# Patient Record
Sex: Male | Born: 1960 | Race: White | Hispanic: No | Marital: Single | Smoking: Current every day smoker
Health system: Southern US, Community
[De-identification: ages and names within clinical notes are randomized; demographics above are authoritative.]

## PROBLEM LIST (undated history)

## (undated) DIAGNOSIS — I10 Essential (primary) hypertension: Secondary | ICD-10-CM

## (undated) DIAGNOSIS — D751 Secondary polycythemia: Secondary | ICD-10-CM

## (undated) DIAGNOSIS — E785 Hyperlipidemia, unspecified: Secondary | ICD-10-CM

## (undated) DIAGNOSIS — E119 Type 2 diabetes mellitus without complications: Secondary | ICD-10-CM

## (undated) DIAGNOSIS — Z87442 Personal history of urinary calculi: Secondary | ICD-10-CM

## (undated) DIAGNOSIS — F17200 Nicotine dependence, unspecified, uncomplicated: Secondary | ICD-10-CM

## (undated) DIAGNOSIS — IMO0001 Reserved for inherently not codable concepts without codable children: Secondary | ICD-10-CM

## (undated) HISTORY — DX: Nicotine dependence, unspecified, uncomplicated: F17.200

## (undated) HISTORY — DX: Type 2 diabetes mellitus without complications: E11.9

## (undated) HISTORY — DX: Hyperlipidemia, unspecified: E78.5

## (undated) HISTORY — DX: Secondary polycythemia: D75.1

## (undated) HISTORY — PX: NO PAST SURGERIES: SHX2092

## (undated) HISTORY — DX: Reserved for inherently not codable concepts without codable children: IMO0001

---

## 2002-10-26 ENCOUNTER — Emergency Department (HOSPITAL_COMMUNITY): Admission: EM | Admit: 2002-10-26 | Discharge: 2002-10-26 | Payer: Self-pay | Admitting: Emergency Medicine

## 2009-04-27 ENCOUNTER — Ambulatory Visit (HOSPITAL_COMMUNITY): Admission: RE | Admit: 2009-04-27 | Discharge: 2009-04-27 | Payer: Self-pay | Admitting: Sports Medicine

## 2009-11-23 ENCOUNTER — Encounter (INDEPENDENT_AMBULATORY_CARE_PROVIDER_SITE_OTHER): Payer: Self-pay | Admitting: *Deleted

## 2009-12-22 ENCOUNTER — Encounter (INDEPENDENT_AMBULATORY_CARE_PROVIDER_SITE_OTHER): Payer: Self-pay | Admitting: *Deleted

## 2009-12-31 ENCOUNTER — Ambulatory Visit: Payer: Self-pay | Admitting: Internal Medicine

## 2010-01-13 ENCOUNTER — Telehealth: Payer: Self-pay | Admitting: Internal Medicine

## 2010-01-14 ENCOUNTER — Ambulatory Visit: Payer: Self-pay | Admitting: Internal Medicine

## 2010-01-14 LAB — HM COLONOSCOPY

## 2010-01-22 ENCOUNTER — Encounter: Payer: Self-pay | Admitting: Internal Medicine

## 2010-03-07 ENCOUNTER — Emergency Department (HOSPITAL_COMMUNITY)
Admission: EM | Admit: 2010-03-07 | Discharge: 2010-03-07 | Payer: Self-pay | Source: Home / Self Care | Admitting: Emergency Medicine

## 2010-05-03 NOTE — Miscellaneous (Signed)
Summary: [facility]  Clinical Lists Changes  Medications: Added new medication of MOVIPREP 100 GM  SOLR (PEG-KCL-NACL-NASULF-NA ASC-C) As per prep instructions. - Signed Rx of MOVIPREP 100 GM  SOLR (PEG-KCL-NACL-NASULF-NA ASC-C) As per prep instructions.;  #1 x 0;  Signed;  Entered by: Ezra Sites RN;  Authorized by: Iva Boop MD, [facility];  Method used: [facility] [address] [phone]*, [address] [place], [place], [place]  [address], Ph: [phone] or [phone], Fax: [phone] Observations: Added new observation of NKA: T (12/31/2009 10:48)    Prescriptions: MOVIPREP 100 GM  SOLR (PEG-KCL-NACL-NASULF-NA ASC-C) As per prep instructions.  #1 x 0   Entered by:   Ezra Sites RN   Authorized by:   Iva Boop MD, [facility]   Signed by:   Ezra Sites RN on 12/31/2009   Method used:   [facility]ronically to        [facility] [address] [phone]* (retail)       [address]       [place], [place]  [address]       Ph: [phone] or [phone]       Fax: [phone]   RxID:   [id number]

## 2010-05-03 NOTE — Progress Notes (Signed)
Summary: Lost prep instructions  Phone Note Call from Patient Call back at [phone]   Call For: Dr Leone Payor Summary of Call: Lost his prep instructions and does not know how to prep for his procedure tomorrow am. Initial call taken by: Leanor Kail [facility],  January 13, 2010 9:30 AM  Follow-up for Phone Call        Left a message at given number.  There was an ID. Pt. told to call me back asap for prep instructions.   I spoke with the patient about fifteen minutes later and explained the prep to him.  I also enouraged him to keep drinking clear liquids so he doesn't get dehydrated.   Patient was fine, and stated that he would see [place] tomorrow. Follow-up by: Clide Cliff, RN

## 2010-05-03 NOTE — Letter (Signed)
Summary: Patient Notice- Polyp Results  [facility]  [address]   [place], [place] [address]   Phone: [phone]  Fax: [phone]        January 22, 2010 MRN: [id number]    [facility] [address] [place], [place]  [address]    Dear Mr. Collin Farmer,  One of the polyps removed from your colon was adenomatous. This means that it was pre-cancerous or that  it had the potential to change into cancer over time. the other polyp was not pre-cancerous.  I recommend that you have a repeat colonoscopy in 5 years to determine if you have developed any new polyps over time and to screen for colorectal cancer. If you develop any new rectal bleeding, abdominal pain or significant bowel habit changes, please contact us before then.  In addition to repeating colonoscopy, changing health habits may reduce your risk of having more colon polyps and possibly, colon cancer. You may lower your risk of future polyps and colon cancer by adopting healthy habits such as not smoking or using tobacco (if you do), being physically active, losing weight (if overweight), and eating a diet which includes fruits and vegetables and limits red meat.  Please call us if you are having persistent problems or have questions about your condition that have not been fully answered at this time.  Sincerely,  Iva Boop MD, [facility]  This letter has been electronically signed by your physician.  Appended Document: Patient Notice- Polyp Results letter mailed

## 2010-05-03 NOTE — Letter (Signed)
Summary: [facility] Instructions  [facility]  [address] [place], [place] [address]   Phone: [phone]  Fax: [phone]       Collin [facility]    Dec 28, 1960    MRN: [id number]        Procedure Day /Date: Friday 01-14-10     Arrival Time: 8:00 a.m.     Procedure Time: 9:00 a.m.     Location of Procedure:                    _x _  [facility] (4th Floor)   PREPARATION FOR COLONOSCOPY WITH MOVIPREP   Starting 5 days prior to your procedure  01-09-10  do not eat nuts, seeds, popcorn, corn, beans, peas,  salads, or any raw vegetables.  Do not take any fiber supplements (e.g. Metamucil, Citrucel, and Benefiber).  THE DAY BEFORE YOUR PROCEDURE         DATE:  01-13-10 DAY: Thursday   1.  Drink clear liquids the entire day-NO SOLID FOOD  2.  Do not drink anything colored red or purple.  Avoid juices with pulp.  No orange juice.  3.  Drink at least 64 oz. (8 glasses) of fluid/clear liquids during the day to prevent dehydration and help the prep work efficiently.  CLEAR LIQUIDS INCLUDE: Water Jello Ice Popsicles Tea (sugar ok, no milk/cream) Powdered fruit flavored drinks Coffee (sugar ok, no milk/cream) Gatorade Juice: apple, white grape, white cranberry  Lemonade Clear bullion, consomm, broth Carbonated beverages (any kind) Strained chicken noodle soup Hard Candy                             4.  In the morning, mix first dose of MoviPrep solution:    Empty 1 Pouch A and 1 Pouch B into the disposable container    Add lukewarm drinking water to the top line of the container. Mix to dissolve    Refrigerate (mixed solution should be used within 24 hrs)  5.  Begin drinking the prep at 5:00 p.m. The MoviPrep container is divided by 4 marks.   Every 15 minutes drink the solution down to the next mark (approximately 8 oz) until the full liter is complete.   6.  Follow completed prep with 16 oz of clear liquid of your choice (Nothing red or  purple).  Continue to drink clear liquids until bedtime.  7.  Before going to bed, mix second dose of MoviPrep solution:    Empty 1 Pouch A and 1 Pouch B into the disposable container    Add lukewarm drinking water to the top line of the container. Mix to dissolve    Refrigerate  THE DAY OF YOUR PROCEDURE      DATE:  01-14-10 DAY:  Friday  Beginning at  4:00 a.m. (5 hours before procedure):         1. Every 15 minutes, drink the solution down to the next mark (approx 8 oz) until the full liter is complete.  2. Follow completed prep with 16 oz. of clear liquid of your choice.    3. You may drink clear liquids until  7:00 a.m. (2 HOURS BEFORE PROCEDURE).   MEDICATION INSTRUCTIONS  Unless otherwise instructed, you should take regular prescription medications with a small sip of water   as early as possible the morning of your procedure.    Additional medication instructions:Do not take Lisinopril/HCTZ day of procedure.  OTHER INSTRUCTIONS  You will need a responsible adult at least 52 years of age to accompany you and drive you home.   This person must remain in the waiting room during your procedure.  Wear loose fitting clothing that is easily removed.  Leave jewelry and other valuables at home.  However, you may wish to bring a book to read or  an iPod/MP3 player to listen to music as you wait for your procedure to start.  Remove all body piercing jewelry and leave at home.  Total time from sign-in until discharge is approximately 2-3 hours.  You should go home directly after your procedure and rest.  You can resume normal activities the  day after your procedure.  The day of your procedure you should not:   Drive   Make legal decisions   Operate machinery   Drink alcohol   Return to work  You will receive specific instructions about eating, activities and medications before you leave.    The above instructions have been reviewed and explained  to me by   Ezra Sites RN  December 31, 2009 11:27 AM     I fully understand and can verbalize these instructions _____________________________ Date _________

## 2010-05-03 NOTE — Letter (Signed)
Summary: Previsit letter  [facility]  [address] [place], [place] [address]   Phone: [phone]  Fax: [phone]       11/23/2009 MRN: [id number]  [facility] [address] [place], [place]  [address]  Dear Collin Farmer,  Welcome to the Gastroenterology Division at [facility].    You are scheduled to see a nurse for your pre-procedure visit on 12/24/2009 at 1:00PM on the 3rd floor at [facility], [address]  We ask that you try to arrive at our office 15 minutes prior to your appointment time to allow for check-in.  Your nurse visit will consist of discussing your medical and surgical history, your immediate family medical history, and your medications.    Please bring a complete list of all your medications or, if you prefer, bring the medication bottles and we will list them.  We will need to be aware of both prescribed and over the counter drugs.  We will need to know exact dosage information as well.  If you are on blood thinners (Coumadin, Plavix, Aggrenox, Ticlid, etc.) please call our office today/prior to your appointment, as we need to consult with your physician about holding your medication.   Please be prepared to read and sign documents such as consent forms, a financial agreement, and acknowledgement forms.  If necessary, and with your consent, a friend or relative is welcome to sit-in on the nurse visit with you.  Please bring your insurance card so that we may make a copy of it.  If your insurance requires a referral to see a specialist, please bring your referral form from your primary care physician.  No co-pay is required for this nurse visit.     If you cannot keep your appointment, please call [phone] to cancel or reschedule prior to your appointment date.  This allows [place] the opportunity to schedule an appointment for another patient in need of care.    Thank you for choosing [facility] for your medical  needs.  We appreciate the opportunity to care for you.  Please visit [place] at our website  to learn more about our practice.                     Sincerely.                                                                                                                   The Gastroenterology Division

## 2010-05-03 NOTE — Procedures (Signed)
Summary: Colonoscopy  Patient: [name] Note: All result statuses are Final unless otherwise noted.  Tests: (1) Colonoscopy (COL)   COL Colonoscopy           DONE     [facility]     [address]     [place], [place]  [address]           COLONOSCOPY PROCEDURE REPORT           PATIENT:  Collin Farmer, Collin Farmer  MR#:  [id number]     BIRTHDATE:  03-03-1961, 49 yrs. old  GENDER:  male     ENDOSCOPIST:  Iva Boop, MD, [facility]     REF. BY:  Lynnea Ferrier, M.D.     PROCEDURE DATE:  01/14/2010     PROCEDURE:  Colonoscopy with snare polypectomy     ASA CLASS:  Class II     INDICATIONS:  Routine Risk Screening     MEDICATIONS:   Fentanyl 75 mcg IV, Versed 8 mg           DESCRIPTION OF PROCEDURE:   After the risks benefits and     alternatives of the procedure were thoroughly explained, informed     consent was obtained.  Digital rectal exam was performed and     revealed no rectal masses and an enlarged prostate.  Mildly     enlarged, smooth, with preserved median sulcus. The LB 180AL     [id number] endoscope was introduced through the anus and advanced to     the cecum, which was identified by both the appendix and ileocecal     valve, without limitations.  The quality of the prep was     excellent, using MoviPrep.  The instrument was then slowly     withdrawn as the colon was fully examined. Insertion: 2:54 minutes     Withdrawal: 13:29 minutes     <<PROCEDUREIMAGES>>           FINDINGS:  Two polyps were found in the rectum. One 3 mm and one     6-7 mm. Polyps were snared without cautery. Retrieval was     successful. snare polyp  Moderate diverticulosis was found in the     sigmoid colon.  This was otherwise a normal examination of the     colon.   Retroflexed views in the rectum revealed no     abnormalities.    The scope was then withdrawn from the patient     and the procedure completed.           COMPLICATIONS:  None     ENDOSCOPIC IMPRESSION:     1) Two polyps  removed from the rectum, (3mm and 6-28mm)     2) Moderate diverticulosis in the sigmoid colon     3) Otherwise normal examination with excellent prep     4) Mildly enlarged prostate without nodules.     RECOMMENDATIONS:     Keep regular follow-up with Dr. Tanya Nones re: prostate.           REPEAT EXAM:  In for Colonoscopy, pending biopsy results.           Iva Boop, MD, [facility]           CC:  Lynnea Ferrier, MD     The Patient           n.     eSIGNED:   Iva Boop at 01/14/2010 09:41 AM  Simon, [place], [id number]  Note: An exclamation mark (!) indicates a result that was not dispersed into the flowsheet. Document Creation Date: 01/14/2010 9:42 AM _______________________________________________________________________  (1) Order result status: Final Collection or observation date-time: 01/14/2010 09:32 Requested date-time:  Receipt date-time:  Reported date-time:  Referring Physician:   Ordering Physician: Stan Head [phone]) Specimen Source:  Source: Launa Grill Order Number: [id number] Lab site:   Appended Document: Colonoscopy: 1 DIMINUTIVE ADENOMA   Colonoscopy  Procedure date:  01/14/2010  Findings:      1) Two polyps removed from the rectum, (3mm and 6-64mm) 1 ADENOMA 1 HYPERPLASTIC     2) Moderate diverticulosis in the sigmoid colon     3) Otherwise normal examination with excellent prep     4) Mildly enlarged prostate without nodules.  Comments:      Repeat colonoscopy in 5 years.   Procedures Next Due Date:    Colonoscopy: 01/2015   Appended Document: Colonoscopy     Procedures Next Due Date:    Colonoscopy: 01/2015

## 2010-06-14 LAB — POCT URINALYSIS DIPSTICK
Glucose, UA: NEGATIVE mg/dL
Ketones, ur: NEGATIVE mg/dL
Protein, ur: NEGATIVE mg/dL
Specific Gravity, Urine: 1.025 (ref 1.005–1.030)

## 2012-06-29 ENCOUNTER — Emergency Department (HOSPITAL_COMMUNITY)
Admission: EM | Admit: 2012-06-29 | Discharge: 2012-06-29 | Disposition: A | Discharge status: Home / Self Care | Payer: BC Managed Care – PPO | Source: Home / Self Care | Attending: Family Medicine | Admitting: Family Medicine

## 2012-06-29 ENCOUNTER — Encounter (HOSPITAL_COMMUNITY): Payer: Self-pay | Admitting: *Deleted

## 2012-06-29 ENCOUNTER — Emergency Department (INDEPENDENT_AMBULATORY_CARE_PROVIDER_SITE_OTHER): Payer: BC Managed Care – PPO

## 2012-06-29 DIAGNOSIS — M65839 Other synovitis and tenosynovitis, unspecified forearm: Secondary | ICD-10-CM

## 2012-06-29 DIAGNOSIS — M65849 Other synovitis and tenosynovitis, unspecified hand: Secondary | ICD-10-CM

## 2012-06-29 DIAGNOSIS — M778 Other enthesopathies, not elsewhere classified: Secondary | ICD-10-CM

## 2012-06-29 HISTORY — DX: Essential (primary) hypertension: I10

## 2012-06-29 MED ORDER — DICLOFENAC SODIUM 1 % TD GEL: 4.0000 g | Freq: Four times a day (QID) | TRANSDERMAL | Status: DC

## 2012-06-29 NOTE — ED Notes (Signed)
C/O bilat metacarpal area discomfort over past 2 wks.  Yesterday started with significant pain, swelling, and tender soft "lumps" to left posterior hand and wrist (dominant hand).  Pt removed wedding ring upon request.

## 2012-06-29 NOTE — ED Provider Notes (Signed)
History     CSN: [id number]  Arrival date & time 06/29/12  1124   First MD Initiated Contact with Patient 06/29/12 1131      Chief Complaint  Patient presents with  . Hand Pain  . Edema    (Consider location/radiation/quality/duration/timing/severity/associated sxs/prior treatment) Patient is a 52 y.o. male presenting with hand pain. The history is provided by the patient.  Hand Pain This is a new (NKI to hand/wrist.) problem. The current episode started more than 1 week ago. The problem has been gradually worsening (more swollen past 2 days.). 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    Past Medical History  Diagnosis Date  . Hypertension     History reviewed. No pertinent past surgical history.  No family history on file.  History  Substance Use Topics  . Smoking status: Current Every Day Smoker -- 0.50 packs/day    Types: Cigarettes  . Smokeless tobacco: Not on file  . Alcohol Use: No      Review of Systems  Constitutional: Negative.   Musculoskeletal: Positive for joint swelling. Negative for myalgias.  Skin: Negative for rash and wound.    Allergies  Other  Home Medications   Current Outpatient Rx  Name  Route  Sig  Dispense  Refill  . aspirin 81 MG tablet   Oral   Take 81 mg by mouth daily.         Marland Kitchen LISINOPRIL-HYDROCHLOROTHIAZIDE PO   Oral   Take by mouth.         . diclofenac sodium (VOLTAREN) 1 % GEL   Topical   Apply 4 g topically 4 (four) times daily.   100 g   2     BP 138/84  Pulse 73  Temp(Src) 97.8 F (36.6 C) (Oral)  Resp 14  Wt 175 lb (79.379 kg)  Physical Exam  Nursing note and vitals reviewed. Constitutional: He is oriented to person, place, and time. He appears well-developed and well-nourished.  Musculoskeletal: He exhibits tenderness.       Hands: Neurological: He is alert and oriented to person, place, and time.  Skin: Skin is warm and dry.    ED Course    Procedures (including critical care time)  Labs Reviewed - No data to display Dg Wrist Complete Left  06/29/2012  *RADIOLOGY REPORT*  Clinical Data: Posterior pain in the left wrist.  LEFT WRIST - COMPLETE 3+ VIEW  Comparison: None.  Findings: Four views of the left wrist were obtained.  No evidence for acute fracture or dislocation. Spurring and degenerative changes in the distal radioulnar joint.  Normal alignment of the left wrist.  IMPRESSION: No acute bony abnormality within the left wrist.  Degenerative changes in the distal radioulnar joint.   Original Report Authenticated By: Richarda Overlie, M.D.      1. Wrist tendonitis       MDM  X-rays reviewed and report per radiologist.        Linna Hoff, MD 06/29/12 1322

## 2013-05-27 ENCOUNTER — Other Ambulatory Visit: Payer: Self-pay | Admitting: Orthopedic Surgery

## 2013-05-27 DIAGNOSIS — M25532 Pain in left wrist: Secondary | ICD-10-CM

## 2013-05-30 ENCOUNTER — Other Ambulatory Visit: Payer: BC Managed Care – PPO

## 2013-06-04 ENCOUNTER — Telehealth: Payer: Self-pay | Admitting: Family Medicine

## 2013-06-04 NOTE — Telephone Encounter (Signed)
Call back number is [phone] Pharmacy is [facility] in [place] Pt is needing a refill on LISINOPRIL-HYDROCHLOROTHIAZIDE PO Simvastatin

## 2013-06-06 ENCOUNTER — Inpatient Hospital Stay: Admission: RE | Admit: 2013-06-06 | Payer: BC Managed Care – PPO | Source: Ambulatory Visit

## 2013-06-06 MED ORDER — LISINOPRIL-HYDROCHLOROTHIAZIDE 20-12.5 MG PO TABS: 1.0000 | ORAL_TABLET | Freq: Every day | ORAL | Status: DC

## 2013-06-06 NOTE — Telephone Encounter (Signed)
Rx Refilled  

## 2013-08-15 ENCOUNTER — Ambulatory Visit: Payer: Self-pay | Admitting: Family Medicine

## 2013-08-22 ENCOUNTER — Ambulatory Visit: Payer: Self-pay | Admitting: Family Medicine

## 2013-08-29 ENCOUNTER — Ambulatory Visit: Payer: Self-pay | Admitting: Family Medicine

## 2013-08-29 ENCOUNTER — Telehealth: Payer: Self-pay | Admitting: Family Medicine

## 2013-08-29 MED ORDER — LISINOPRIL-HYDROCHLOROTHIAZIDE 20-12.5 MG PO TABS
1.0000 | ORAL_TABLET | Freq: Every day | ORAL | Status: DC
Start: 2013-08-29 — End: 2013-09-05

## 2013-08-29 NOTE — Telephone Encounter (Signed)
Rx Refilled  

## 2013-08-29 NOTE — Telephone Encounter (Signed)
[phone] Pt had an apt to come in this morning but due to power outage he has rescheduled till next Friday but he is out of his med and needs a refill  lisinopril-hydrochlorothiazide (PRINZIDE,ZESTORETIC) 20-12.5 MG per tablet   [facility]

## 2013-09-05 ENCOUNTER — Encounter: Payer: Self-pay | Admitting: Family Medicine

## 2013-09-05 ENCOUNTER — Ambulatory Visit (INDEPENDENT_AMBULATORY_CARE_PROVIDER_SITE_OTHER): Payer: BC Managed Care – PPO | Admitting: Family Medicine

## 2013-09-05 VITALS — BP 120/74 | HR 78 | Temp 98.3°F | Resp 16 | Ht 69.0 in | Wt 212.0 lb

## 2013-09-05 DIAGNOSIS — I10 Essential (primary) hypertension: Secondary | ICD-10-CM

## 2013-09-05 DIAGNOSIS — Z125 Encounter for screening for malignant neoplasm of prostate: Secondary | ICD-10-CM

## 2013-09-05 DIAGNOSIS — Z23 Encounter for immunization: Secondary | ICD-10-CM

## 2013-09-05 DIAGNOSIS — F17200 Nicotine dependence, unspecified, uncomplicated: Secondary | ICD-10-CM | POA: Insufficient documentation

## 2013-09-05 LAB — COMPLETE METABOLIC PANEL WITH GFR
ALK PHOS: 74 U/L (ref 39–117)
ALT: 39 U/L (ref 0–53)
AST: 23 U/L (ref 0–37)
Albumin: 4.1 g/dL (ref 3.5–5.2)
BILIRUBIN TOTAL: 0.5 mg/dL (ref 0.2–1.2)
BUN: 10 mg/dL (ref 6–23)
CALCIUM: 9.4 mg/dL (ref 8.4–10.5)
CHLORIDE: 100 meq/L (ref 96–112)
CO2: 27 mEq/L (ref 19–32)
CREATININE: 0.88 mg/dL (ref 0.50–1.35)
GFR, Est African American: >89 mL/min
GFR, Est Non African American: >89 mL/min
Glucose, Bld: 78 mg/dL (ref 70–99)
Potassium: 4.5 mEq/L (ref 3.5–5.3)
Sodium: 140 mEq/L (ref 135–145)
Total Protein: 6.8 g/dL (ref 6.0–8.3)

## 2013-09-05 LAB — CBC WITH DIFFERENTIAL/PLATELET
BASOS ABS: 0 10*3/uL (ref 0.0–0.1)
BASOS PCT: 0 % (ref 0–1)
EOS ABS: 0.1 10*3/uL (ref 0.0–0.7)
Eosinophils Relative: 1 % (ref 0–5)
HCT: 52.6 % — ABNORMAL HIGH (ref 39.0–52.0)
Hemoglobin: 18.9 g/dL — ABNORMAL HIGH (ref 13.0–17.0)
Lymphocytes Relative: 24 % (ref 12–46)
Lymphs Abs: 2.3 10*3/uL (ref 0.7–4.0)
MCH: 31.2 pg (ref 26.0–34.0)
MCHC: 35.9 g/dL (ref 30.0–36.0)
MCV: 86.8 fL (ref 78.0–100.0)
Monocytes Absolute: 0.9 10*3/uL (ref 0.1–1.0)
Monocytes Relative: 9 % (ref 3–12)
NEUTROS ABS: 6.3 10*3/uL (ref 1.7–7.7)
NEUTROS PCT: 66 % (ref 43–77)
PLATELETS: 219 10*3/uL (ref 150–400)
RBC: 6.06 MIL/uL — ABNORMAL HIGH (ref 4.22–5.81)
RDW: 14.3 % (ref 11.5–15.5)
WBC: 9.6 10*3/uL (ref 4.0–10.5)

## 2013-09-05 LAB — LIPID PANEL
CHOL/HDL RATIO: 4.8 ratio
Cholesterol: 191 mg/dL (ref 0–200)
HDL: 40 mg/dL (ref >39)
LDL CALC: 136 mg/dL — AB (ref 0–99)
TRIGLYCERIDES: 77 mg/dL (ref <150)
VLDL: 15 mg/dL (ref 0–40)

## 2013-09-05 MED ORDER — LISINOPRIL-HYDROCHLOROTHIAZIDE 20-12.5 MG PO TABS: 1.0000 | ORAL_TABLET | Freq: Every day | ORAL | Status: DC

## 2013-09-05 MED ORDER — HYDROCORTISONE 2.5 % RE CREA: 1.0000 "application " | TOPICAL_CREAM | Freq: Two times a day (BID) | RECTAL | Status: DC

## 2013-09-05 NOTE — Progress Notes (Signed)
Subjective:    Patient ID: Collin Farmer, male    DOB: 02-15-1961, 53 y.o.   MRN: [id number]  HPI Patient coming in for refill on his blood pressure medication. He is currently taking Zestoretic 20/12.5 one by mouth daily. His blood pressure is well controlled at 120/74. He denies any chest pain, dyspnea on exertion, angina, shortness of breath. He denies any cough or cramping. He is overdue for fasting lab work. Unfortunately is not exercising regularly. He also continues to smoke half a pack of serous per day. His colonoscopy is up to date. He is due for a tetanus vaccine. He is also due for a prostate exam. Unfortunately is suffering from an external hemorrhoid. Therefore he declines a rectal exam the prostate. He is also requesting cream to treat the hemorrhoid. Past Medical History  Diagnosis Date  . Hypertension   . Smoking    Current Outpatient Prescriptions on File Prior to Visit  Medication Sig Dispense Refill  . aspirin 81 MG tablet Take 81 mg by mouth daily.      . diclofenac sodium (VOLTAREN) 1 % GEL Apply 4 g topically 4 (four) times daily.  100 g  2   No current facility-administered medications on file prior to visit.   Allergies  Allergen Reactions  . Other     Bee stings   History   Social History  . Marital Status: Single    Spouse Name: N/A    Number of Children: N/A  . Years of Education: N/A   Occupational History  . Not on file.   Social History Main Topics  . Smoking status: Current Every Day Smoker -- 0.50 packs/day    Types: Cigarettes  . Smokeless tobacco: Not on file  . Alcohol Use: No  . Drug Use: No  . Sexual Activity: Not on file   Other Topics Concern  . Not on file   Social History Narrative  . No narrative on file      Review of Systems  All other systems reviewed and are negative.      Objective:   Physical Exam  Vitals reviewed. Constitutional: He appears well-developed and well-nourished. No distress.  HENT:  Head:  Normocephalic and atraumatic.  Eyes: Conjunctivae are normal. No scleral icterus.  Neck: Neck supple. No JVD present. No thyromegaly present.  Cardiovascular: Normal rate, regular rhythm and normal heart sounds.  Exam reveals no gallop and no friction rub.   No murmur heard. Pulmonary/Chest: Effort normal and breath sounds normal. No respiratory distress. He has no wheezes. He has no rales. He exhibits no tenderness.  Abdominal: Soft. Bowel sounds are normal. He exhibits no distension and no mass. There is no tenderness. There is no rebound and no guarding.  Musculoskeletal: He exhibits no edema.  Lymphadenopathy:    He has no cervical adenopathy.  Skin: He is not diaphoretic.          Assessment & Plan:  1. HTN (hypertension) Blood pressure is well controlled. Continue Zestoretic 20/12.5 one by mouth daily. I will also check a fasting lipid panel. I like the patient's LDL cholesterol less than 100 due to his smoking. I recommended he continue a baby aspirin 81 mg by mouth daily. Also recommended that he quit smoking. The patient is in the pre-contemplative phase. Is not ready to quit.  I also recommended increasing aerobic exercise. - COMPLETE METABOLIC PANEL WITH GFR - Lipid panel - CBC with Differential - lisinopril-hydrochlorothiazide (PRINZIDE,ZESTORETIC) 20-12.5 MG per tablet;  Take 1 tablet by mouth daily.  Dispense: 30 tablet; Refill: 11  2. Prostate cancer screening - PSA  I also gave patient prescription for Anusol-HC cream to apply twice a day to the hemorrhoid for the next 7 days.

## 2013-09-05 NOTE — Addendum Note (Signed)
Addended by: Shary Decamp B on: 09/05/2013 09:37 AM   Modules accepted: Orders

## 2013-09-06 LAB — PSA: PSA: 0.99 ng/mL (ref <=4.00)

## 2013-09-12 ENCOUNTER — Encounter: Payer: Self-pay | Admitting: Family Medicine

## 2014-03-31 ENCOUNTER — Ambulatory Visit (INDEPENDENT_AMBULATORY_CARE_PROVIDER_SITE_OTHER): Payer: BC Managed Care – PPO | Admitting: Family Medicine

## 2014-03-31 VITALS — BP 122/74 | HR 87 | Temp 98.2°F | Resp 17 | Ht 68.5 in | Wt 220.0 lb

## 2014-03-31 DIAGNOSIS — L989 Disorder of the skin and subcutaneous tissue, unspecified: Secondary | ICD-10-CM

## 2014-03-31 DIAGNOSIS — L820 Inflamed seborrheic keratosis: Secondary | ICD-10-CM

## 2014-03-31 DIAGNOSIS — D224 Melanocytic nevi of scalp and neck: Secondary | ICD-10-CM

## 2014-03-31 NOTE — Progress Notes (Signed)
Skin Lesion left Neck EXCISION: Verbal consent obtained. Local anesthesia with 2cc of 2% lido with epi. Sterile prep and drape. Incision using a 15 blade, the lesion was dissected from the surrounding tissue with curved hemostats and scissors. Sent for pathology.  Wound closed with #2 5-0 horizontal mattress and #1 5-0 simple interrupted sutures. Cleansed and dressed.  Skin Lesion Left Shoulder EXCISION: Verbal consent obtained. Local anesthesia with 4cc of 2% lido with epi. Sterile prep and drape. Incision using a 15 blade, the lesion was dissected from the surrounding tissue with curved hemostats and scissors. Wound edges were undermined for better closure. Sent for pathology. Wound closed with #4 4-0 horizontal mattress sutures. Cleansed and dressed.  Cryotherapy two freeze/thaw cycles on 2 lesions mid back thought to be SKs.   Julieta Gutting, PA-C Physician Assistant-Certified [facility]  03/31/2014 1:02 PM

## 2014-03-31 NOTE — Progress Notes (Signed)
   Subjective:    Patient ID: Collin Farmer, male    DOB: September 15, 1960, 53 y.o.   MRN: [id number]  This chart was scribed for Collin Knapp, MD by Stephania Fragmin, ED Scribe. This patient was seen in room 4 and the patient's care was started at 10:15 AM.    Chief Complaint  Patient presents with  . wants moles removed     HPI  HPI Comments: Collin Farmer is a 53 y.o. male who presents to the [facility] for a removal of a mid back mole that he describes as "old, just like me," as patient wants to have a tattoo dedicated to his father done on his back. He states that his mole might be prone to bleeding at times. He had a prior biopsy done for a mole on his neck, which returned benign.     Past Medical History  Diagnosis Date  . Hypertension   . Smoking     Current Outpatient Prescriptions on File Prior to Visit  Medication Sig Dispense Refill  . aspirin 81 MG tablet Take 81 mg by mouth daily.    . diclofenac sodium (VOLTAREN) 1 % GEL Apply 4 g topically 4 (four) times daily. 100 g 2  . hydrocortisone (ANUSOL-HC) 2.5 % rectal cream Place 1 application rectally 2 (two) times daily. 30 g 1  . lisinopril-hydrochlorothiazide (PRINZIDE,ZESTORETIC) 20-12.5 MG per tablet Take 1 tablet by mouth daily. 30 tablet 11   No current facility-administered medications on file prior to visit.   Allergies  Allergen Reactions  . Other     Bee stings     Review of Systems  Constitutional: Negative for fever.  Skin: Positive for color change.  Hematological: Does not bruise/bleed easily.       Objective:  BP 122/74 mmHg  Pulse 87  Temp(Src) 98.2 F (36.8 C) (Oral)  Resp 17  Ht 5' 8.5" (1.74 m)  Wt 220 lb (99.791 kg)  BMI 32.96 kg/m2  SpO2 96%    Physical Exam  Constitutional: He is oriented to person, place, and time. He appears well-developed and well-nourished. No distress.  HENT:  Head: Normocephalic and atraumatic.  Eyes: Conjunctivae and EOM are normal.    Neck: Neck supple. No tracheal deviation present.  Cardiovascular: Normal rate.   Pulmonary/Chest: Effort normal. No respiratory distress.  Musculoskeletal: Normal range of motion.  Neurological: He is alert and oriented to person, place, and time.  Skin: Skin is warm and dry.  Irritated, rough seborrheic keratosis on mid back, 3 mm in diameter. 3-4 mm slightly raised nodule on left lower neck. 1.5 cm raised nodule on left shoulder.  Psychiatric: He has a normal mood and affect. His behavior is normal.  Nursing note and vitals reviewed.          Assessment & Plan:   10:17 AM-Discussed treatment plan which includes Lidocaine injection, mid back SK cryotherapy, 2 mole biopsies on shoulder and neck, with pt and pt agreed to plan.  Nevus of neck - Plan: Dermatology pathology  Skin lesion of left upper extremity - Plan: Dermatology pathology  Seborrheic keratoses, inflamed   I personally performed the services described in this documentation, which was scribed in my presence. The recorded information has been reviewed and considered, and addended by me as needed.  Delman Cheadle, MD MPH

## 2014-04-10 ENCOUNTER — Ambulatory Visit (INDEPENDENT_AMBULATORY_CARE_PROVIDER_SITE_OTHER): Payer: BLUE CROSS/BLUE SHIELD | Admitting: Physician Assistant

## 2014-04-10 VITALS — HR 71 | Temp 98.1°F | Resp 16

## 2014-04-10 DIAGNOSIS — D224 Melanocytic nevi of scalp and neck: Secondary | ICD-10-CM

## 2014-04-10 DIAGNOSIS — L989 Disorder of the skin and subcutaneous tissue, unspecified: Secondary | ICD-10-CM

## 2014-04-10 NOTE — Progress Notes (Signed)
   Subjective:    Patient ID: Collin Farmer, male    DOB: 1961-01-08, 54 y.o.   MRN: [id number]  HPI  This is a 54 year old male presenting for suture removal. He had two moles removed 8 days ago - one on left neck and one on left shoulder. He has not had any problems. He denies pain, drainage, fever or chills.  Review of Systems  Constitutional: Negative for fever and chills.  Gastrointestinal: Negative for nausea and vomiting.  Skin: Positive for wound (healing).   Patient Active Problem List   Diagnosis Date Noted  . Smoking    Home meds: None  Allergies  Allergen Reactions  . Other     Bee stings   Patient's social and family history were reviewed.     Objective:   Physical Exam  Constitutional: He is oriented to person, place, and time. He appears well-developed and well-nourished. No distress.  HENT:  Head: Normocephalic and atraumatic.  Right Ear: Hearing normal.  Left Ear: Hearing normal.  Nose: Nose normal.  Eyes: Conjunctivae and lids are normal. Right eye exhibits no discharge. Left eye exhibits no discharge. No scleral icterus.  Pulmonary/Chest: Effort normal. No respiratory distress.  Musculoskeletal: Normal range of motion.  Neurological: He is alert and oriented to person, place, and time.  Skin: Skin is warm and dry.  Two healing lesions on left should and left neck from mole removals 8 days ago. No erythema or drainage. No evidence of infection. #7 sutures removed.  Psychiatric: He has a normal mood and affect. His speech is normal and behavior is normal. Thought content normal.  Pulse 71  Temp(Src) 98.1 F (36.7 C) (Oral)  Resp 16  SpO2 97%     Assessment & Plan:  1. Nevus of neck 2. Skin lesion of left upper extremity Healing well, no evidence of infection. #7 sutures removed. He will return with any concerns.   Benjaman Pott Collin Farmer, MHS [facility]  04/10/2014

## 2014-06-29 ENCOUNTER — Ambulatory Visit (INDEPENDENT_AMBULATORY_CARE_PROVIDER_SITE_OTHER): Payer: BLUE CROSS/BLUE SHIELD | Admitting: Family Medicine

## 2014-06-29 VITALS — BP 158/92 | HR 99 | Temp 98.2°F | Resp 18 | Ht 68.25 in | Wt 218.6 lb

## 2014-06-29 DIAGNOSIS — J209 Acute bronchitis, unspecified: Secondary | ICD-10-CM

## 2014-06-29 DIAGNOSIS — J0100 Acute maxillary sinusitis, unspecified: Secondary | ICD-10-CM | POA: Diagnosis not present

## 2014-06-29 MED ORDER — LEVOFLOXACIN 500 MG PO TABS: 500.0000 mg | ORAL_TABLET | Freq: Every day | ORAL | Status: DC

## 2014-06-29 MED ORDER — HYDROCOD POLST-CHLORPHEN POLST 10-8 MG/5ML PO LQCR: 5.0000 mL | Freq: Two times a day (BID) | ORAL | Status: DC | PRN

## 2014-06-29 NOTE — Progress Notes (Signed)
Subjective:  This chart was scribed for Robyn Haber, MD by Jeanell Sparrow, ED Scribe. This patient was seen in room 13 and the patient's care was started at 8:08 PM.   Patient ID: Collin Farmer, male    DOB: [birth year], 54 y.o.   MRN: [id number] Chief Complaint  Patient presents with   Cough   Sinus Problem   HPI HPI Comments: Collin Farmer is a 54 y.o. male who presents to the [facility] complaining of flu-like symptoms that started 3 days ago.  He reports that he has been having congestion, intermittent moderate cough, and a constant moderate subjective fever. He states that his cough has been improving, but is still persistent. He reports having chest pain and back pain due to cough. He states that he has been having sleep disturbance due to his cough.   He states that he has a hx of smoking.   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   Patient Active Problem List   Diagnosis Date Noted   Smoking    Past Medical History  Diagnosis Date   Hypertension    Smoking    History reviewed. No pertinent past surgical history. Allergies  Allergen Reactions   Other     Bee stings   Prior to Admission medications   Medication Sig Start Date End Date Taking? Authorizing Provider  aspirin 81 MG tablet Take 81 mg by mouth daily.   Yes Historical Provider, MD  lisinopril-hydrochlorothiazide (PRINZIDE,ZESTORETIC) 20-12.5 MG per tablet Take 1 tablet by mouth daily. 09/05/13  Yes Susy Frizzle, MD  hydrocortisone (ANUSOL-HC) 2.5 % rectal cream Place 1 application rectally 2 (two) times daily. Patient not taking: Reported on 06/29/2014 09/05/13   Susy Frizzle, MD   History   Social History   Marital Status: Single    Spouse Name: N/A   Number of Children: N/A   Years of Education: N/A   Occupational History   Not on file.   Social History Main Topics   Smoking status: Current Every Day Smoker -- 0.50 packs/day for 37 years      Types: Cigarettes   Smokeless tobacco: Not on file   Alcohol Use: No   Drug Use: No   Sexual Activity: No   Other Topics Concern   Not on file   Social History Narrative    Review of Systems  Constitutional: Positive for fever.  HENT: Positive for congestion.   Respiratory: Positive for cough.   Cardiovascular: Positive for chest pain.  Musculoskeletal: Positive for back pain.  Psychiatric/Behavioral: Positive for sleep disturbance.       Objective:   Physical Exam This is a well-developed and muscular middle-aged man in no acute distress with a violent cough. HEENT: Normal throat, normal ears, marked sinus congestion with reddened nasal mucosa Chest: Few faint expiratory wheezes Heart: Regular no murmur   BP 158/92 mmHg   Pulse 99   Temp(Src) 98.2 F (36.8 C) (Oral)   Resp 18   Ht 5' 8.25" (1.734 m)   Wt 218 lb 9.6 oz (99.156 kg)   BMI 32.98 kg/m2   SpO2 95%    Assessment & Plan:   This chart was scribed in my presence and reviewed by me personally.    ICD-9-CM ICD-10-CM   1. Acute maxillary sinusitis, recurrence not specified 461.0 J01.00 levofloxacin (LEVAQUIN) 500 MG tablet  2. Acute bronchitis, unspecified organism 466.0 J20.9 chlorpheniramine-HYDROcodone (TUSSIONEX PENNKINETIC ER) 10-8 MG/5ML [facility]  Signed, Robyn Haber, MD

## 2014-06-29 NOTE — Patient Instructions (Signed)
Using Afrin nasal spray or the generic form of it for couple nights will help his sleep and will be safe.    Sinusitis Sinusitis is redness, soreness, and inflammation of the paranasal sinuses. Paranasal sinuses are air pockets within the bones of your face (beneath the eyes, the middle of the forehead, or above the eyes). In healthy paranasal sinuses, mucus is able to drain out, and air is able to circulate through them by way of your nose. However, when your paranasal sinuses are inflamed, mucus and air can become trapped. This can allow bacteria and other germs to grow and cause infection. Sinusitis can develop quickly and last only a short time (acute) or continue over a long period (chronic). Sinusitis that lasts for more than 12 weeks is considered chronic.  CAUSES  Causes of sinusitis include:  Allergies.  Structural abnormalities, such as displacement of the cartilage that separates your nostrils (deviated septum), which can decrease the air flow through your nose and sinuses and affect sinus drainage.  Functional abnormalities, such as when the small hairs (cilia) that line your sinuses and help remove mucus do not work properly or are not present. SIGNS AND SYMPTOMS  Symptoms of acute and chronic sinusitis are the same. The primary symptoms are pain and pressure around the affected sinuses. Other symptoms include:  Upper toothache.  Earache.  Headache.  Bad breath.  Decreased sense of smell and taste.  A cough, which worsens when you are lying flat.  Fatigue.  Fever.  Thick drainage from your nose, which often is green and may contain pus (purulent).  Swelling and warmth over the affected sinuses. DIAGNOSIS  Your health care provider will perform a physical exam. During the exam, your health care provider may:  Look in your nose for signs of abnormal growths in your nostrils (nasal polyps).  Tap over the affected sinus to check for signs of infection.  View the  inside of your sinuses (endoscopy) using an imaging device that has a light attached (endoscope). If your health care provider suspects that you have chronic sinusitis, one or more of the following tests may be recommended:  Allergy tests.  Nasal culture. A sample of mucus is taken from your nose, sent to a lab, and screened for bacteria.  Nasal cytology. A sample of mucus is taken from your nose and examined by your health care provider to determine if your sinusitis is related to an allergy. TREATMENT  Most cases of acute sinusitis are related to a viral infection and will resolve on their own within 10 days. Sometimes medicines are prescribed to help relieve symptoms (pain medicine, decongestants, nasal steroid sprays, or saline sprays).  However, for sinusitis related to a bacterial infection, your health care provider will prescribe antibiotic medicines. These are medicines that will help kill the bacteria causing the infection.  Rarely, sinusitis is caused by a fungal infection. In theses cases, your health care provider will prescribe antifungal medicine. For some cases of chronic sinusitis, surgery is needed. Generally, these are cases in which sinusitis recurs more than 3 times per year, despite other treatments. HOME CARE INSTRUCTIONS   Drink plenty of water. Water helps thin the mucus so your sinuses can drain more easily.  Use a humidifier.  Inhale steam 3 to 4 times a day (for example, sit in the bathroom with the shower running).  Apply a warm, moist washcloth to your face 3 to 4 times a day, or as directed by your health care provider.  Use saline nasal sprays to help moisten and clean your sinuses.  Take medicines only as directed by your health care provider.  If you were prescribed either an antibiotic or antifungal medicine, finish it all even if you start to feel better. SEEK IMMEDIATE MEDICAL CARE IF:  You have increasing pain or severe headaches.  You have  nausea, vomiting, or drowsiness.  You have swelling around your face.  You have vision problems.  You have a stiff neck.  You have difficulty breathing. MAKE SURE YOU:   Understand these instructions.  Will watch your condition.  Will get help right away if you are not doing well or get worse. Document Released: 03/20/2005 Document Revised: 08/04/2013 Document Reviewed: 04/04/2011 [facility] Patient Information 2015 [place], [place]. This information is not intended to replace advice given to you by your health care provider. Make sure you discuss any questions you have with your health care provider.

## 2014-10-07 ENCOUNTER — Encounter: Payer: Self-pay | Admitting: Internal Medicine

## 2014-11-11 ENCOUNTER — Encounter: Payer: Self-pay | Admitting: Family Medicine

## 2014-11-11 ENCOUNTER — Other Ambulatory Visit: Payer: Self-pay | Admitting: Family Medicine

## 2014-11-11 DIAGNOSIS — I10 Essential (primary) hypertension: Secondary | ICD-10-CM

## 2014-11-11 MED ORDER — LISINOPRIL-HYDROCHLOROTHIAZIDE 20-12.5 MG PO TABS: 1.0000 | ORAL_TABLET | Freq: Every day | ORAL | Status: DC

## 2014-11-11 NOTE — Telephone Encounter (Signed)
LOV 09/05/13.   Medication refill for one time only.  Patient needs to be seen.  Letter sent for patient to call and schedule

## 2015-01-08 ENCOUNTER — Ambulatory Visit (INDEPENDENT_AMBULATORY_CARE_PROVIDER_SITE_OTHER): Payer: BLUE CROSS/BLUE SHIELD | Admitting: Family Medicine

## 2015-01-08 ENCOUNTER — Encounter: Payer: Self-pay | Admitting: Family Medicine

## 2015-01-08 VITALS — BP 132/84 | HR 80 | Temp 98.2°F | Resp 18 | Ht 69.0 in | Wt 211.0 lb

## 2015-01-08 DIAGNOSIS — I10 Essential (primary) hypertension: Secondary | ICD-10-CM | POA: Diagnosis not present

## 2015-01-08 DIAGNOSIS — Z23 Encounter for immunization: Secondary | ICD-10-CM

## 2015-01-08 LAB — LIPID PANEL
Cholesterol: 171 mg/dL (ref 125–200)
HDL: 41 mg/dL (ref >=40)
LDL Cholesterol: 117 mg/dL (ref <130)
TRIGLYCERIDES: 64 mg/dL (ref <150)
Total CHOL/HDL Ratio: 4.2 Ratio (ref <=5.0)
VLDL: 13 mg/dL (ref <30)

## 2015-01-08 LAB — COMPLETE METABOLIC PANEL WITH GFR
ALBUMIN: 4.2 g/dL (ref 3.6–5.1)
ALK PHOS: 69 U/L (ref 40–115)
ALT: 30 U/L (ref 9–46)
AST: 20 U/L (ref 10–35)
BILIRUBIN TOTAL: 0.5 mg/dL (ref 0.2–1.2)
BUN: 11 mg/dL (ref 7–25)
CALCIUM: 9.1 mg/dL (ref 8.6–10.3)
CO2: 28 mmol/L (ref 20–31)
CREATININE: 0.79 mg/dL (ref 0.70–1.33)
Chloride: 107 mmol/L (ref 98–110)
Glucose, Bld: 91 mg/dL (ref 70–99)
Potassium: 4.3 mmol/L (ref 3.5–5.3)
Sodium: 140 mmol/L (ref 135–146)
TOTAL PROTEIN: 6.5 g/dL (ref 6.1–8.1)

## 2015-01-08 LAB — CBC WITH DIFFERENTIAL/PLATELET
Basophils Absolute: 0 10*3/uL (ref 0.0–0.1)
Basophils Relative: 0 % (ref 0–1)
EOS ABS: 0.1 10*3/uL (ref 0.0–0.7)
Eosinophils Relative: 1 % (ref 0–5)
HEMATOCRIT: 51.6 % (ref 39.0–52.0)
HEMOGLOBIN: 17.7 g/dL — AB (ref 13.0–17.0)
LYMPHS ABS: 2.5 10*3/uL (ref 0.7–4.0)
LYMPHS PCT: 25 % (ref 12–46)
MCH: 30.5 pg (ref 26.0–34.0)
MCHC: 34.3 g/dL (ref 30.0–36.0)
MCV: 88.8 fL (ref 78.0–100.0)
MONOS PCT: 11 % (ref 3–12)
MPV: 10.3 fL (ref 8.6–12.4)
Monocytes Absolute: 1.1 10*3/uL — ABNORMAL HIGH (ref 0.1–1.0)
NEUTROS PCT: 63 % (ref 43–77)
Neutro Abs: 6.4 10*3/uL (ref 1.7–7.7)
PLATELETS: 245 10*3/uL (ref 150–400)
RBC: 5.81 MIL/uL (ref 4.22–5.81)
RDW: 13.7 % (ref 11.5–15.5)
WBC: 10.1 10*3/uL (ref 4.0–10.5)

## 2015-01-08 MED ORDER — LISINOPRIL-HYDROCHLOROTHIAZIDE 20-12.5 MG PO TABS: 1.0000 | ORAL_TABLET | Freq: Every day | ORAL | Status: DC

## 2015-01-08 NOTE — Progress Notes (Signed)
Subjective:    Patient ID: Collin Farmer, male    DOB: 29-Apr-1960, 54 y.o.   MRN: [id number]  HPI 09/05/13 Patient coming in for refill on his blood pressure medication. He is currently taking Zestoretic 20/12.5 one by mouth daily. His blood pressure is well controlled at 120/74. He denies any chest pain, dyspnea on exertion, angina, shortness of breath. He denies any cough or cramping. He is overdue for fasting lab work. Unfortunately is not exercising regularly. He also continues to smoke half a pack of serous per day. His colonoscopy is up to date. He is due for a tetanus vaccine. He is also due for a prostate exam. Unfortunately is suffering from an external hemorrhoid. Therefore he declines a rectal exam the prostate. He is also requesting cream to treat the hemorrhoid.  At that time, my plan was: 1. HTN (hypertension) Blood pressure is well controlled. Continue Zestoretic 20/12.5 one by mouth daily. I will also check a fasting lipid panel. I like the patient's LDL cholesterol less than 100 due to his smoking. I recommended he continue a baby aspirin 81 mg by mouth daily. Also recommended that he quit smoking. The patient is in the pre-contemplative phase. Is not ready to quit.  I also recommended increasing aerobic exercise. - COMPLETE METABOLIC PANEL WITH GFR - Lipid panel - CBC with Differential - lisinopril-hydrochlorothiazide (PRINZIDE,ZESTORETIC) 20-12.5 MG per tablet; Take 1 tablet by mouth daily.  Dispense: 30 tablet; Refill: 11 2. Prostate cancer screening - PSA I also gave patient prescription for Anusol-HC cream to apply twice a day to the hemorrhoid for the next 7 days.  01/08/15 Patient has not been seen since.  Here today for refill of blood pressure medication.  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  It was felt to be due likely to smoking.  He was advised to quit smoking.  I wanted to check JAK-2 mutation however, I don't see where it was checked.  Unfortunately, the  patient continues to smoke.  He denies, chest pain, doe, orthopnea, or hemoptysis.  Otherwise, he is doing well.  Patient received his flu shot today Past Medical History  Diagnosis Date  . Hypertension   . Smoking    Current Outpatient Prescriptions on File Prior to Visit  Medication Sig Dispense Refill  . aspirin 81 MG tablet Take 81 mg by mouth daily.    . chlorpheniramine-HYDROcodone (TUSSIONEX PENNKINETIC ER) 10-8 MG/5ML LQCR Take 5 mLs by mouth every 12 (twelve) hours as needed. 115 mL 0  . hydrocortisone (ANUSOL-HC) 2.5 % rectal cream Place 1 application rectally 2 (two) times daily. (Patient not taking: Reported on 06/29/2014) 30 g 1  . levofloxacin (LEVAQUIN) 500 MG tablet Take 1 tablet (500 mg total) by mouth daily. 7 tablet 0  . lisinopril-hydrochlorothiazide (PRINZIDE,ZESTORETIC) 20-12.5 MG per tablet Take 1 tablet by mouth daily. 30 tablet 0   No current facility-administered medications on file prior to visit.   Allergies  Allergen Reactions  . Other     Bee stings   Social History   Social History  . Marital Status: Single    Spouse Name: N/A  . Number of Children: N/A  . Years of Education: N/A   Occupational History  . Not on file.   Social History Main Topics  . Smoking status: Current Every Day Smoker -- 0.50 packs/day for 37 years    Types: Cigarettes  . Smokeless tobacco: Not on file  . Alcohol Use: No  . Drug Use: No  .  Sexual Activity: No   Other Topics Concern  . Not on file   Social History Narrative      Review of Systems  All other systems reviewed and are negative.      Objective:   Physical Exam  Constitutional: He appears well-developed and well-nourished. No distress.  HENT:  Head: Normocephalic and atraumatic.  Eyes: Conjunctivae are normal. No scleral icterus.  Neck: Neck supple. No JVD present. No thyromegaly present.  Cardiovascular: Normal rate, regular rhythm and normal heart sounds.  Exam reveals no gallop and no  friction rub.   No murmur heard. Pulmonary/Chest: Effort normal and breath sounds normal. No respiratory distress. He has no wheezes. He has no rales. He exhibits no tenderness.  Abdominal: Soft. Bowel sounds are normal. He exhibits no distension and no mass. There is no tenderness. There is no rebound and no guarding.  Musculoskeletal: He exhibits no edema.  Lymphadenopathy:    He has no cervical adenopathy.  Skin: He is not diaphoretic.  Vitals reviewed.         Assessment & Plan:  Essential hypertension - Plan: CBC with Differential/Platelet, COMPLETE METABOLIC PANEL WITH GFR, Lipid panel, lisinopril-hydrochlorothiazide (PRINZIDE,ZESTORETIC) 20-12.5 MG tablet  Patient's blood pressures well controlled. Continue current medications at their present dosages. I will check a fasting lipid panel. Goal LDL cholesterol is less than 130. I have recommended smoking cessation but the patient has no desire to quit smoking at the present time. I will check a CBC and if his hemoglobin continues to rise, I will consult a hematologist

## 2015-03-01 ENCOUNTER — Encounter: Payer: Self-pay | Admitting: Internal Medicine

## 2015-12-21 ENCOUNTER — Encounter: Payer: Self-pay | Admitting: Family Medicine

## 2015-12-21 ENCOUNTER — Ambulatory Visit (INDEPENDENT_AMBULATORY_CARE_PROVIDER_SITE_OTHER): Payer: BLUE CROSS/BLUE SHIELD | Admitting: Family Medicine

## 2015-12-21 VITALS — BP 116/70 | HR 80 | Temp 98.3°F | Resp 16 | Ht 69.0 in | Wt 208.0 lb

## 2015-12-21 DIAGNOSIS — L259 Unspecified contact dermatitis, unspecified cause: Secondary | ICD-10-CM

## 2015-12-21 MED ORDER — METHYLPREDNISOLONE ACETATE 80 MG/ML IJ SUSP
80.0000 mg | Freq: Once | INTRAMUSCULAR | Status: AC
  Administered 2015-12-21: 80 mg via INTRAMUSCULAR

## 2015-12-21 MED ORDER — PREDNISONE 20 MG PO TABS: ORAL_TABLET | ORAL | 0 refills | Status: DC

## 2015-12-21 NOTE — Progress Notes (Signed)
   Subjective:    Patient ID: Collin Farmer, male    DOB: [birth year], 55 y.o.   MRN: [id number]  HPI Over the weekend, the patient was exposed to poison oak or poison ivy while working outside in his planned as an [employer] [place]. He now has an erythematous papular rash on his forehead, the bridge of his nose, his left cheek, both hands, and on his abdomen and genitalia. It is extremely itchy and pruritic. He is tried Benadryl with very little relief Past Medical History:  Diagnosis Date  . Hypertension   . Smoking    No past surgical history on file. Current Outpatient Prescriptions on File Prior to Visit  Medication Sig Dispense Refill  . aspirin 81 MG tablet Take 81 mg by mouth daily.    Marland Kitchen lisinopril-hydrochlorothiazide (PRINZIDE,ZESTORETIC) 20-12.5 MG tablet Take 1 tablet by mouth daily. 30 tablet 11   No current facility-administered medications on file prior to visit.    Allergies  Allergen Reactions  . Other     Bee stings   Social History   Social History  . Marital status: Single    Spouse name: N/A  . Number of children: N/A  . Years of education: N/A   Occupational History  . Not on file.   Social History Main Topics  . Smoking status: Current Every Day Smoker    Packs/day: 0.50    Years: 37.00    Types: Cigarettes  . Smokeless tobacco: Not on file  . Alcohol use No  . Drug use: No  . Sexual activity: No   Other Topics Concern  . Not on file   Social History Narrative  . No narrative on file      Review of Systems  All other systems reviewed and are negative.      Objective:   Physical Exam  Cardiovascular: Normal rate, regular rhythm and normal heart sounds.   Pulmonary/Chest: Effort normal and breath sounds normal.  Skin: Rash noted. There is erythema.  Vitals reviewed.         Assessment & Plan:  Contact dermatitis - Plan: predniSONE (DELTASONE) 20 MG tablet  Patient seems miserable. I will treat him with 80 mg of Depo-Medrol 1 now and  begin prednisone taper pack starting in the morning.

## 2015-12-21 NOTE — Addendum Note (Signed)
Addended by: Shary Decamp B on: 12/21/2015 03:07 PM   Modules accepted: Orders

## 2016-01-18 ENCOUNTER — Other Ambulatory Visit: Payer: Self-pay | Admitting: Family Medicine

## 2016-01-18 DIAGNOSIS — I10 Essential (primary) hypertension: Secondary | ICD-10-CM

## 2016-06-14 ENCOUNTER — Telehealth: Payer: Self-pay | Admitting: Family Medicine

## 2016-06-14 DIAGNOSIS — I10 Essential (primary) hypertension: Secondary | ICD-10-CM

## 2016-06-14 MED ORDER — LISINOPRIL-HYDROCHLOROTHIAZIDE 20-12.5 MG PO TABS: 1.0000 | ORAL_TABLET | Freq: Every day | ORAL | 0 refills | Status: DC

## 2016-06-14 NOTE — Telephone Encounter (Signed)
Medication called/sent to requested pharmacy  

## 2016-06-14 NOTE — Telephone Encounter (Signed)
Patient has appt with dr Dennard Schaumann on Monday, however is out of his lisinopril, would like some called in enough to last him until his appt if possible  [facility] [place]  [phone]

## 2016-06-19 ENCOUNTER — Ambulatory Visit (INDEPENDENT_AMBULATORY_CARE_PROVIDER_SITE_OTHER): Payer: BLUE CROSS/BLUE SHIELD | Admitting: Family Medicine

## 2016-06-19 ENCOUNTER — Encounter: Payer: Self-pay | Admitting: Family Medicine

## 2016-06-19 VITALS — BP 126/76 | HR 78 | Temp 98.0°F | Resp 16 | Ht 69.0 in | Wt 207.0 lb

## 2016-06-19 DIAGNOSIS — Z125 Encounter for screening for malignant neoplasm of prostate: Secondary | ICD-10-CM | POA: Diagnosis not present

## 2016-06-19 DIAGNOSIS — I10 Essential (primary) hypertension: Secondary | ICD-10-CM

## 2016-06-19 MED ORDER — LISINOPRIL-HYDROCHLOROTHIAZIDE 20-12.5 MG PO TABS: 1.0000 | ORAL_TABLET | Freq: Every day | ORAL | 11 refills | Status: DC

## 2016-06-19 NOTE — Progress Notes (Signed)
      Subjective:    Patient ID: Collin Farmer, male    DOB: Aug 04, 1960, 56 y.o.   MRN: [id number]  HPI6/5/15 Patient coming in for refill on his blood pressure medication. He is currently taking Zestoretic 20/12.5 one by mouth daily. His blood pressure is well controlled.  Unfortunately he continues to smoke. At the present time he has no desire to quit smoking. Mother was recently in the hospital with hemorrhagic anemia. Patient is due for lab work including a CBC CMP. He would also like to get a PSA while here for prostate cancer screening. His colonoscopy is up-to-date and was last performed in 2011. Past Medical History:  Diagnosis Date  . Hypertension   . Smoking    Current Outpatient Prescriptions on File Prior to Visit  Medication Sig Dispense Refill  . aspirin 81 MG tablet Take 81 mg by mouth daily.     No current facility-administered medications on file prior to visit.    Allergies  Allergen Reactions  . Other     Bee stings   Social History   Social History  . Marital status: Single    Spouse name: N/A  . Number of children: N/A  . Years of education: N/A   Occupational History  . Not on file.   Social History Main Topics  . Smoking status: Current Every Day Smoker    Packs/day: 0.50    Years: 37.00    Types: Cigarettes  . Smokeless tobacco: Never Used  . Alcohol use No  . Drug use: No  . Sexual activity: No   Other Topics Concern  . Not on file   Social History Narrative  . No narrative on file      Review of Systems  All other systems reviewed and are negative.      Objective:   Physical Exam  Constitutional: He appears well-developed and well-nourished. No distress.  HENT:  Head: Normocephalic and atraumatic.  Eyes: Conjunctivae are normal. No scleral icterus.  Neck: Neck supple. No JVD present. No thyromegaly present.  Cardiovascular: Normal rate, regular rhythm and normal heart sounds.  Exam reveals no gallop and no friction rub.   No  murmur heard. Pulmonary/Chest: Effort normal and breath sounds normal. No respiratory distress. He has no wheezes. He has no rales. He exhibits no tenderness.  Abdominal: Soft. Bowel sounds are normal. He exhibits no distension and no mass. There is no tenderness. There is no rebound and no guarding.  Musculoskeletal: He exhibits no edema.  Lymphadenopathy:    He has no cervical adenopathy.  Skin: He is not diaphoretic.  Vitals reviewed.         Assessment & Plan:  Prostate cancer screening - Plan: PSA  Essential hypertension - Plan: lisinopril-hydrochlorothiazide (PRINZIDE,ZESTORETIC) 20-12.5 MG tablet, CBC with Differential/Platelet, COMPLETE METABOLIC PANEL WITH GFR  Patient's blood pressures well controlled. Continue current medications at their present dosages.Check a CBC, CMP. Will checking lab work I will also check a PSA. Colonoscopy is up-to-date but will screen the patient for prostate cancer. We also discussed starting Chantix for smoking cessation. At the present time the patient politely declines as he is not interested.

## 2016-06-20 LAB — CBC WITH DIFFERENTIAL/PLATELET
BASOS ABS: 0 {cells}/uL (ref 0–200)
Basophils Relative: 0 %
EOS ABS: 90 {cells}/uL (ref 15–500)
Eosinophils Relative: 1 %
HEMATOCRIT: 53.5 % — AB (ref 38.5–50.0)
HEMOGLOBIN: 18.1 g/dL — AB (ref 13.0–17.0)
LYMPHS PCT: 30 %
Lymphs Abs: 2700 cells/uL (ref 850–3900)
MCH: 30.6 pg (ref 27.0–33.0)
MCHC: 33.8 g/dL (ref 32.0–36.0)
MCV: 90.5 fL (ref 80.0–100.0)
MONO ABS: 900 {cells}/uL (ref 200–950)
MPV: 10.2 fL (ref 7.5–12.5)
Monocytes Relative: 10 %
NEUTROS PCT: 59 %
Neutro Abs: 5310 cells/uL (ref 1500–7800)
Platelets: 223 10*3/uL (ref 140–400)
RBC: 5.91 MIL/uL — AB (ref 4.20–5.80)
RDW: 13.6 % (ref 11.0–15.0)
WBC: 9 10*3/uL (ref 3.8–10.8)

## 2016-06-20 LAB — COMPLETE METABOLIC PANEL WITH GFR
ALBUMIN: 4.4 g/dL (ref 3.6–5.1)
ALK PHOS: 62 U/L (ref 40–115)
ALT: 26 U/L (ref 9–46)
AST: 21 U/L (ref 10–35)
BUN: 11 mg/dL (ref 7–25)
CALCIUM: 9.7 mg/dL (ref 8.6–10.3)
CHLORIDE: 103 mmol/L (ref 98–110)
CO2: 30 mmol/L (ref 20–31)
Creat: 0.89 mg/dL (ref 0.70–1.33)
GFR, Est Non African American: >89 mL/min (ref >=60)
Glucose, Bld: 101 mg/dL — ABNORMAL HIGH (ref 70–99)
POTASSIUM: 4.3 mmol/L (ref 3.5–5.3)
Sodium: 140 mmol/L (ref 135–146)
Total Bilirubin: 0.6 mg/dL (ref 0.2–1.2)
Total Protein: 6.9 g/dL (ref 6.1–8.1)

## 2016-06-20 LAB — PSA: PSA: 1 ng/mL (ref <=4.0)

## 2016-06-21 ENCOUNTER — Encounter: Payer: Self-pay | Admitting: Family Medicine

## 2016-06-27 ENCOUNTER — Telehealth: Payer: Self-pay | Admitting: Family Medicine

## 2016-06-27 MED ORDER — VARENICLINE TARTRATE 0.5 MG X 11 & 1 MG X 42 PO MISC: ORAL | 0 refills | Status: DC

## 2016-06-27 NOTE — Telephone Encounter (Signed)
Pt called and after thinking about it he would like to try Chantix and would like rx sent to pharm. Med sent to pharm.

## 2016-06-28 ENCOUNTER — Telehealth: Payer: Self-pay | Admitting: Family Medicine

## 2016-06-28 NOTE — Telephone Encounter (Signed)
Rx for Chantix was sent to [facility] 06/27/16

## 2016-06-28 NOTE — Telephone Encounter (Signed)
Pt says he is ready to try to quit smoking.  Said you would call Chantix in for him.

## 2016-09-13 ENCOUNTER — Encounter: Payer: Self-pay | Admitting: Family Medicine

## 2016-09-13 ENCOUNTER — Ambulatory Visit (INDEPENDENT_AMBULATORY_CARE_PROVIDER_SITE_OTHER): Payer: BLUE CROSS/BLUE SHIELD | Admitting: Family Medicine

## 2016-09-13 ENCOUNTER — Telehealth: Payer: Self-pay | Admitting: Family Medicine

## 2016-09-13 VITALS — BP 134/84 | HR 86 | Temp 98.4°F | Resp 16 | Ht 69.0 in | Wt 208.0 lb

## 2016-09-13 DIAGNOSIS — L255 Unspecified contact dermatitis due to plants, except food: Secondary | ICD-10-CM

## 2016-09-13 MED ORDER — METHYLPREDNISOLONE ACETATE 80 MG/ML IJ SUSP
80.0000 mg | Freq: Once | INTRAMUSCULAR | Status: AC
  Administered 2016-09-13: 80 mg via INTRAMUSCULAR

## 2016-09-13 MED ORDER — PREDNISONE 10 MG PO TABS: ORAL_TABLET | ORAL | 0 refills | Status: DC

## 2016-09-13 NOTE — Telephone Encounter (Signed)
Pt has broken out in posion ivy wants to know if we can call in something like he had last year.

## 2016-09-13 NOTE — Telephone Encounter (Signed)
Patient seen in office and given Prednisone taper.

## 2016-09-13 NOTE — Patient Instructions (Addendum)
Benadryl at bedtime Steroid by mouth - start tomorrow  F/U as needed

## 2016-09-13 NOTE — Progress Notes (Signed)
   Subjective:    Patient ID: Collin Farmer, male    DOB: 25-Nov-1960, 56 y.o.   MRN: [id number]  Patient presents for Skin Irritation (possible posion ivy to face, arms, abd)  Issue here was poison ivy to his abdomen is worse started on Sunday. It is now spread to his face and his arms needs an area on his buttocks. It is very itchy in nature. He has some mild swelling underneath his right eye that started today when he noticed the spot. He knows that he has in his backyard. He has not had any difficulty breathing no cough no difficulty swallowing. He has been taking loratadine,used anti-itch topical gel   Review Of Systems:  GEN- denies fatigue, fever, weight loss,weakness, recent illness HEENT- denies eye drainage, change in vision, nasal discharge, CVS- denies chest pain, palpitations RESP- denies SOB, cough, wheeze ABD- denies N/V, change in stools, abd pain GU- denies dysuria, hematuria, dribbling, incontinence MSK- denies joint pain, muscle aches, injury Neuro- denies headache, dizziness, syncope, seizure activity       Objective:    BP 134/84   Pulse 86   Temp 98.4 F (36.9 C) (Oral)   Resp 16   Ht 5\' 9"  (1.753 m)   Wt 208 lb (94.3 kg)   SpO2 98%   BMI 30.72 kg/m  GEN- NAD, alert and oriented x3 HEENT- PERRL, EOMI, non injected sclera, pink conjunctiva, MMM, oropharynx clear Neck- Supple, no LAD CVS- RRR, no murmur RESP-CTAB Skin- erythematous blister linear on abdomen, linear streak left foreahead, mild erythematous maculopapular lesions on forehead and beneath right eye, behind right ear       Assessment & Plan:      Problem List Items Addressed This Visit    None    Visit Diagnoses    Dermatitis due to plants, including poison ivy, sumac, and oak    -  Primary   Treat Depomedrol 80mg  and prednisone taper, benadryl at bedtime   Relevant Medications   methylPREDNISolone acetate (DEPO-MEDROL) injection 80 mg (Completed)      Note: This dictation was  prepared with Dragon dictation along with smaller phrase technology. Any transcriptional errors that result from this process are unintentional.

## 2017-06-19 ENCOUNTER — Ambulatory Visit: Payer: BLUE CROSS/BLUE SHIELD | Admitting: Family Medicine

## 2017-06-19 ENCOUNTER — Encounter: Payer: Self-pay | Admitting: Family Medicine

## 2017-06-19 VITALS — BP 144/80 | HR 86 | Temp 98.2°F | Resp 16 | Ht 69.0 in | Wt 210.0 lb

## 2017-06-19 DIAGNOSIS — Z125 Encounter for screening for malignant neoplasm of prostate: Secondary | ICD-10-CM | POA: Diagnosis not present

## 2017-06-19 DIAGNOSIS — Z23 Encounter for immunization: Secondary | ICD-10-CM | POA: Diagnosis not present

## 2017-06-19 DIAGNOSIS — I10 Essential (primary) hypertension: Secondary | ICD-10-CM | POA: Diagnosis not present

## 2017-06-19 MED ORDER — LISINOPRIL-HYDROCHLOROTHIAZIDE 20-12.5 MG PO TABS: 1.0000 | ORAL_TABLET | Freq: Every day | ORAL | 11 refills | Status: DC

## 2017-06-19 NOTE — Progress Notes (Signed)
Subjective:    Patient ID: Collin Farmer, male    DOB: 1960/09/19, 57 y.o.   MRN: [id number]  Medication Refill    Patient coming in for refill on his blood pressure medication. He is currently taking Zestoretic 20/12.5 one by mouth daily.  Blood pressure today is marginally elevated at 144/80.  He denies any chest pain shortness of breath or dyspnea on exertion.  He continues to smoke.  He admits that he is not minimally committed to quitting at the present time.  He did not like the way Chantix made him feel the last time he tried it and he had no success on Wellbutrin.  Colonoscopy is up-to-date and is not due again until the patient turns 58.  He is due for prostate cancer screening.  He is also due for Pneumovax 23 because of his smoking history Past Medical History:  Diagnosis Date  . Hypertension   . Smoking    Current Outpatient Medications on File Prior to Visit  Medication Sig Dispense Refill  . aspirin 81 MG tablet Take 81 mg by mouth daily.    Marland Kitchen lisinopril-hydrochlorothiazide (PRINZIDE,ZESTORETIC) 20-12.5 MG tablet Take 1 tablet by mouth daily. 30 tablet 11  . predniSONE (DELTASONE) 10 MG tablet Take 40mg  x 3 days,20mg  x 3 days, 10mg  x 3 days 21 tablet 0   No current facility-administered medications on file prior to visit.    Allergies  Allergen Reactions  . Bee Venom Anaphylaxis  . Other     Bee stings   Social History   Socioeconomic History  . Marital status: Single    Spouse name: Not on file  . Number of children: Not on file  . Years of education: Not on file  . Highest education level: Not on file  Social Needs  . Financial resource strain: Not on file  . Food insecurity - worry: Not on file  . Food insecurity - inability: Not on file  . Transportation needs - medical: Not on file  . Transportation needs - non-medical: Not on file  Occupational History  . Not on file  Tobacco Use  . Smoking status: Current Every Day Smoker    Packs/day: 0.50   Years: 37.00    Pack years: 18.50    Types: Cigarettes  . Smokeless tobacco: Never Used  Substance and Sexual Activity  . Alcohol use: No    Alcohol/week: 0.0 oz  . Drug use: No  . Sexual activity: No    Birth control/protection: Abstinence  Other Topics Concern  . Not on file  Social History Narrative  . Not on file      Review of Systems  All other systems reviewed and are negative.      Objective:   Physical Exam  Constitutional: He appears well-developed and well-nourished. No distress.  HENT:  Head: Normocephalic and atraumatic.  Eyes: Conjunctivae are normal. No scleral icterus.  Neck: Neck supple. No JVD present. No thyromegaly present.  Cardiovascular: Normal rate, regular rhythm and normal heart sounds. Exam reveals no gallop and no friction rub.  No murmur heard. Pulmonary/Chest: Effort normal and breath sounds normal. No respiratory distress. He has no wheezes. He has no rales. He exhibits no tenderness.  Abdominal: Soft. Bowel sounds are normal. He exhibits no distension and no mass. There is no tenderness. There is no rebound and no guarding.  Musculoskeletal: He exhibits no edema.  Lymphadenopathy:    He has no cervical adenopathy.  Skin: He is  not diaphoretic.  Vitals reviewed.         Assessment & Plan:  Essential hypertension - Plan: CBC with Differential/Platelet, COMPLETE METABOLIC PANEL WITH GFR, Lipid panel  Prostate cancer screening - Plan: PSA  Colonoscopy is up-to-date.  Continue to encourage smoking cessation.  Blood pressure today is borderline elevated.  Patient has a way of checking his blood pressure at home and would like to record his blood pressures at home over the next few weeks and let me know the values.  If greater than 568 systolic, I would increase his blood pressure medication.  Patient received Pneumovax 23 today.  He declined HIV and hepatitis C screening.  Screen for prostate cancer with a PSA

## 2017-06-19 NOTE — Addendum Note (Signed)
Addended by: Shary Decamp B on: 06/19/2017 02:46 PM   Modules accepted: Orders

## 2017-06-20 LAB — CBC WITH DIFFERENTIAL/PLATELET
Basophils Absolute: 94 cells/uL (ref 0–200)
Basophils Relative: 0.8 %
EOS PCT: 1.2 %
Eosinophils Absolute: 142 cells/uL (ref 15–500)
HEMATOCRIT: 52.6 % — AB (ref 38.5–50.0)
HEMOGLOBIN: 18.4 g/dL — AB (ref 13.2–17.1)
LYMPHS ABS: 3174 {cells}/uL (ref 850–3900)
MCH: 30 pg (ref 27.0–33.0)
MCHC: 35 g/dL (ref 32.0–36.0)
MCV: 85.8 fL (ref 80.0–100.0)
MPV: 10.7 fL (ref 7.5–12.5)
Monocytes Relative: 8.9 %
NEUTROS ABS: 7340 {cells}/uL (ref 1500–7800)
Neutrophils Relative %: 62.2 %
Platelets: 249 10*3/uL (ref 140–400)
RBC: 6.13 10*6/uL — AB (ref 4.20–5.80)
RDW: 12.9 % (ref 11.0–15.0)
Total Lymphocyte: 26.9 %
WBC: 11.8 10*3/uL — AB (ref 3.8–10.8)
WBCMIX: 1050 {cells}/uL — AB (ref 200–950)

## 2017-06-20 LAB — LIPID PANEL
CHOL/HDL RATIO: 3.6 (calc) (ref <5.0)
Cholesterol: 158 mg/dL (ref <200)
HDL: 44 mg/dL (ref >40)
LDL Cholesterol (Calc): 98 mg/dL (calc)
NON-HDL CHOLESTEROL (CALC): 114 mg/dL (ref <130)
Triglycerides: 72 mg/dL (ref <150)

## 2017-06-20 LAB — COMPLETE METABOLIC PANEL WITH GFR
AG RATIO: 1.5 (calc) (ref 1.0–2.5)
ALBUMIN MSPROF: 4.3 g/dL (ref 3.6–5.1)
ALT: 30 U/L (ref 9–46)
AST: 28 U/L (ref 10–35)
Alkaline phosphatase (APISO): 64 U/L (ref 40–115)
BUN: 12 mg/dL (ref 7–25)
CALCIUM: 9.5 mg/dL (ref 8.6–10.3)
CO2: 31 mmol/L (ref 20–32)
CREATININE: 0.85 mg/dL (ref 0.70–1.33)
Chloride: 102 mmol/L (ref 98–110)
GFR, EST AFRICAN AMERICAN: 111 mL/min/{1.73_m2} (ref >=60)
GFR, EST NON AFRICAN AMERICAN: 96 mL/min/{1.73_m2} (ref >=60)
Globulin: 2.8 g/dL (calc) (ref 1.9–3.7)
Glucose, Bld: 122 mg/dL — ABNORMAL HIGH (ref 65–99)
Potassium: 4.1 mmol/L (ref 3.5–5.3)
SODIUM: 138 mmol/L (ref 135–146)
TOTAL PROTEIN: 7.1 g/dL (ref 6.1–8.1)
Total Bilirubin: 0.9 mg/dL (ref 0.2–1.2)

## 2017-06-20 LAB — PSA: PSA: 0.8 ng/mL (ref <=4.0)

## 2017-06-25 ENCOUNTER — Encounter: Payer: Self-pay | Admitting: Family Medicine

## 2017-06-26 ENCOUNTER — Other Ambulatory Visit: Payer: Self-pay | Admitting: Family Medicine

## 2017-06-26 DIAGNOSIS — D582 Other hemoglobinopathies: Secondary | ICD-10-CM

## 2017-10-15 ENCOUNTER — Encounter: Payer: Self-pay | Admitting: Family Medicine

## 2017-10-15 ENCOUNTER — Ambulatory Visit: Payer: BLUE CROSS/BLUE SHIELD | Admitting: Family Medicine

## 2017-10-15 VITALS — BP 128/80 | HR 70 | Temp 98.1°F | Resp 18 | Ht 69.0 in | Wt 212.0 lb

## 2017-10-15 DIAGNOSIS — D582 Other hemoglobinopathies: Secondary | ICD-10-CM

## 2017-10-15 DIAGNOSIS — F17200 Nicotine dependence, unspecified, uncomplicated: Secondary | ICD-10-CM

## 2017-10-15 DIAGNOSIS — R739 Hyperglycemia, unspecified: Secondary | ICD-10-CM

## 2017-10-15 DIAGNOSIS — I10 Essential (primary) hypertension: Secondary | ICD-10-CM | POA: Diagnosis not present

## 2017-10-15 NOTE — Progress Notes (Signed)
Subjective:    Patient ID: Collin Farmer, male    DOB: 11/26/1960, 57 y.o.   MRN: [id number]  Medication Refill   06/19/17 Patient coming in for refill on his blood pressure medication. He is currently taking Zestoretic 20/12.5 one by mouth daily.  Blood pressure today is marginally elevated at 144/80.  He denies any chest pain shortness of breath or dyspnea on exertion.  He continues to smoke.  He admits that he is not minimally committed to quitting at the present time.  He did not like the way Chantix made him feel the last time he tried it and he had no success on Wellbutrin.  Colonoscopy is up-to-date and is not due again until the patient turns 58.  He is due for prostate cancer screening.  He is also due for Pneumovax 23 because of his smoking history.  At that time, my plan was: Colonoscopy is up-to-date.  Continue to encourage smoking cessation.  Blood pressure today is borderline elevated.  Patient has a way of checking his blood pressure at home and would like to record his blood pressures at home over the next few weeks and let me know the values.  If greater than 025 systolic, I would increase his blood pressure medication.  Patient received Pneumovax 23 today.  He declined HIV and hepatitis C screening.  Screen for prostate cancer with a PSA  10/15/17 His labs at that time revealed a hemoglobin of 18.4 as well as a random sugar of 122.  I recommended smoking cessation and recheck a CBC in 3 months and also check hemoglobin A1c.  Patient is here today for follow-up.  At his last visit, the patient was not fasting.  Therefore he believes the sugar was a random sugar and not indicative of his glycemic control which is reasonable.  Unfortunately he continues to smoke.  He is tried and failed Wellbutrin in the past as well as Chantix.  He had better success with nicotine patches.  At the present time he is pre-contemplative.  He denies any witnessed apneic episodes or hypersomnolence during  the daytime Past Medical History:  Diagnosis Date  . Hypertension   . Smoking    Current Outpatient Medications on File Prior to Visit  Medication Sig Dispense Refill  . aspirin 81 MG tablet Take 81 mg by mouth daily.    Marland Kitchen lisinopril-hydrochlorothiazide (PRINZIDE,ZESTORETIC) 20-12.5 MG tablet Take 1 tablet by mouth daily. 30 tablet 11   No current facility-administered medications on file prior to visit.    Allergies  Allergen Reactions  . Bee Venom Anaphylaxis  . Other     Bee stings   Social History   Socioeconomic History  . Marital status: Single    Spouse name: Not on file  . Number of children: Not on file  . Years of education: Not on file  . Highest education level: Not on file  Occupational History  . Not on file  Social Needs  . Financial resource strain: Not on file  . Food insecurity:    Worry: Not on file    Inability: Not on file  . Transportation needs:    Medical: Not on file    Non-medical: Not on file  Tobacco Use  . Smoking status: Current Every Day Smoker    Packs/day: 0.50    Years: 37.00    Pack years: 18.50    Types: Cigarettes  . Smokeless tobacco: Never Used  Substance and Sexual Activity  .  Alcohol use: No    Alcohol/week: 0.0 oz  . Drug use: No  . Sexual activity: Never    Birth control/protection: Abstinence  Lifestyle  . Physical activity:    Days per week: Not on file    Minutes per session: Not on file  . Stress: Not on file  Relationships  . Social connections:    Talks on phone: Not on file    Gets together: Not on file    Attends religious service: Not on file    Active member of club or organization: Not on file    Attends meetings of clubs or organizations: Not on file    Relationship status: Not on file  . Intimate partner violence:    Fear of current or ex partner: Not on file    Emotionally abused: Not on file    Physically abused: Not on file    Forced sexual activity: Not on file  Other Topics Concern  . Not  on file  Social History Narrative  . Not on file      Review of Systems  All other systems reviewed and are negative.      Objective:   Physical Exam  Constitutional: He appears well-developed and well-nourished. No distress.  HENT:  Head: Normocephalic and atraumatic.  Eyes: Conjunctivae are normal. No scleral icterus.  Neck: Neck supple. No JVD present. No thyromegaly present.  Cardiovascular: Normal rate, regular rhythm and normal heart sounds. Exam reveals no gallop and no friction rub.  No murmur heard. Pulmonary/Chest: Effort normal and breath sounds normal. No respiratory distress. He has no wheezes. He has no rales. He exhibits no tenderness.  Abdominal: Soft. Bowel sounds are normal. He exhibits no distension and no mass. There is no tenderness. There is no rebound and no guarding.  Musculoskeletal: He exhibits no edema.  Lymphadenopathy:    He has no cervical adenopathy.  Skin: He is not diaphoretic.  Vitals reviewed.         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  Recheck again today.  If even higher, I would recommend a sleep study to rule out obstructive sleep apnea, and I would also check lab work to rule out Jak 2 mutation.  Continue to encourage smoking cessation.  We discussed possibly trying a combination of Wellbutrin with nicotine patches.  He will consider that.  His blood pressure is great today at 128/80.  His elevated blood sugar was likely due to eating at his last visit.  He is fasting today.  We will check a hemoglobin A1c to verify if in fact the patient is prediabetic

## 2017-10-16 ENCOUNTER — Encounter: Payer: Self-pay | Admitting: Family Medicine

## 2017-10-16 DIAGNOSIS — E119 Type 2 diabetes mellitus without complications: Secondary | ICD-10-CM | POA: Insufficient documentation

## 2017-10-16 DIAGNOSIS — E785 Hyperlipidemia, unspecified: Secondary | ICD-10-CM | POA: Insufficient documentation

## 2017-10-16 DIAGNOSIS — D751 Secondary polycythemia: Secondary | ICD-10-CM | POA: Insufficient documentation

## 2017-10-16 LAB — CBC WITH DIFFERENTIAL/PLATELET
BASOS PCT: 0.7 %
Basophils Absolute: 74 cells/uL (ref 0–200)
EOS ABS: 117 {cells}/uL (ref 15–500)
Eosinophils Relative: 1.1 %
HCT: 55.9 % — ABNORMAL HIGH (ref 38.5–50.0)
Hemoglobin: 19.4 g/dL — ABNORMAL HIGH (ref 13.2–17.1)
LYMPHS ABS: 1919 {cells}/uL (ref 850–3900)
MCH: 30.2 pg (ref 27.0–33.0)
MCHC: 34.7 g/dL (ref 32.0–36.0)
MCV: 86.9 fL (ref 80.0–100.0)
MPV: 11 fL (ref 7.5–12.5)
Monocytes Relative: 7.9 %
Neutro Abs: 7653 cells/uL (ref 1500–7800)
Neutrophils Relative %: 72.2 %
PLATELETS: 226 10*3/uL (ref 140–400)
RBC: 6.43 10*6/uL — AB (ref 4.20–5.80)
RDW: 12.6 % (ref 11.0–15.0)
Total Lymphocyte: 18.1 %
WBC: 10.6 10*3/uL (ref 3.8–10.8)
WBCMIX: 837 {cells}/uL (ref 200–950)

## 2017-10-16 LAB — LIPID PANEL
CHOLESTEROL: 183 mg/dL (ref <200)
HDL: 44 mg/dL (ref >40)
LDL Cholesterol (Calc): 120 mg/dL (calc) — ABNORMAL HIGH
Non-HDL Cholesterol (Calc): 139 mg/dL (calc) — ABNORMAL HIGH (ref <130)
TRIGLYCERIDES: 86 mg/dL (ref <150)
Total CHOL/HDL Ratio: 4.2 (calc) (ref <5.0)

## 2017-10-16 LAB — HEMOGLOBIN A1C
EAG (MMOL/L): 11.7 (calc)
HEMOGLOBIN A1C: 9 %{Hb} — AB (ref <5.7)
MEAN PLASMA GLUCOSE: 212 (calc)

## 2017-10-16 LAB — COMPLETE METABOLIC PANEL WITH GFR
AG RATIO: 1.6 (calc) (ref 1.0–2.5)
ALKALINE PHOSPHATASE (APISO): 89 U/L (ref 40–115)
ALT: 34 U/L (ref 9–46)
AST: 26 U/L (ref 10–35)
Albumin: 4.4 g/dL (ref 3.6–5.1)
BUN: 12 mg/dL (ref 7–25)
CHLORIDE: 99 mmol/L (ref 98–110)
CO2: 29 mmol/L (ref 20–32)
Calcium: 9.7 mg/dL (ref 8.6–10.3)
Creat: 0.84 mg/dL (ref 0.70–1.33)
GFR, Est African American: 112 mL/min/{1.73_m2} (ref >=60)
GFR, Est Non African American: 97 mL/min/{1.73_m2} (ref >=60)
GLUCOSE: 221 mg/dL — AB (ref 65–99)
Globulin: 2.7 g/dL (calc) (ref 1.9–3.7)
POTASSIUM: 4.3 mmol/L (ref 3.5–5.3)
Sodium: 136 mmol/L (ref 135–146)
Total Bilirubin: 0.9 mg/dL (ref 0.2–1.2)
Total Protein: 7.1 g/dL (ref 6.1–8.1)

## 2017-10-22 ENCOUNTER — Other Ambulatory Visit: Payer: Self-pay | Admitting: Family Medicine

## 2017-10-22 ENCOUNTER — Other Ambulatory Visit: Payer: Self-pay

## 2017-10-22 DIAGNOSIS — D751 Secondary polycythemia: Secondary | ICD-10-CM

## 2017-10-22 DIAGNOSIS — G4730 Sleep apnea, unspecified: Secondary | ICD-10-CM

## 2017-10-22 MED ORDER — ATORVASTATIN CALCIUM 20 MG PO TABS: 20.0000 mg | ORAL_TABLET | Freq: Every day | ORAL | 3 refills | Status: DC

## 2017-10-22 MED ORDER — METFORMIN HCL 500 MG PO TABS: 500.0000 mg | ORAL_TABLET | Freq: Two times a day (BID) | ORAL | 3 refills | Status: DC

## 2017-10-26 ENCOUNTER — Inpatient Hospital Stay (HOSPITAL_COMMUNITY): Payer: BLUE CROSS/BLUE SHIELD | Attending: Internal Medicine | Admitting: Internal Medicine

## 2017-10-26 ENCOUNTER — Encounter (HOSPITAL_COMMUNITY): Payer: Self-pay | Admitting: Internal Medicine

## 2017-10-26 ENCOUNTER — Inpatient Hospital Stay (HOSPITAL_COMMUNITY): Payer: BLUE CROSS/BLUE SHIELD

## 2017-10-26 ENCOUNTER — Other Ambulatory Visit: Payer: BLUE CROSS/BLUE SHIELD

## 2017-10-26 ENCOUNTER — Telehealth: Payer: Self-pay | Admitting: Family Medicine

## 2017-10-26 VITALS — BP 114/76 | HR 72 | Temp 98.3°F | Resp 16 | Ht 69.0 in | Wt 211.2 lb

## 2017-10-26 DIAGNOSIS — E785 Hyperlipidemia, unspecified: Secondary | ICD-10-CM | POA: Diagnosis not present

## 2017-10-26 DIAGNOSIS — D751 Secondary polycythemia: Secondary | ICD-10-CM

## 2017-10-26 DIAGNOSIS — Z720 Tobacco use: Secondary | ICD-10-CM

## 2017-10-26 DIAGNOSIS — E119 Type 2 diabetes mellitus without complications: Secondary | ICD-10-CM | POA: Diagnosis not present

## 2017-10-26 DIAGNOSIS — R0602 Shortness of breath: Secondary | ICD-10-CM | POA: Insufficient documentation

## 2017-10-26 DIAGNOSIS — Z7984 Long term (current) use of oral hypoglycemic drugs: Secondary | ICD-10-CM | POA: Diagnosis not present

## 2017-10-26 DIAGNOSIS — F17210 Nicotine dependence, cigarettes, uncomplicated: Secondary | ICD-10-CM | POA: Diagnosis not present

## 2017-10-26 DIAGNOSIS — Z79899 Other long term (current) drug therapy: Secondary | ICD-10-CM | POA: Diagnosis not present

## 2017-10-26 DIAGNOSIS — I10 Essential (primary) hypertension: Secondary | ICD-10-CM | POA: Diagnosis not present

## 2017-10-26 DIAGNOSIS — Z7982 Long term (current) use of aspirin: Secondary | ICD-10-CM | POA: Diagnosis not present

## 2017-10-26 LAB — CBC WITH DIFFERENTIAL/PLATELET
Basophils Absolute: 0 10*3/uL (ref 0.0–0.1)
Basophils Relative: 0 %
Eosinophils Absolute: 0.1 10*3/uL (ref 0.0–0.7)
Eosinophils Relative: 1 %
HCT: 54.1 % — ABNORMAL HIGH (ref 39.0–52.0)
HEMOGLOBIN: 18.4 g/dL — AB (ref 13.0–17.0)
LYMPHS ABS: 3.3 10*3/uL (ref 0.7–4.0)
Lymphocytes Relative: 31 %
MCH: 30.4 pg (ref 26.0–34.0)
MCHC: 34 g/dL (ref 30.0–36.0)
MCV: 89.4 fL (ref 78.0–100.0)
MONO ABS: 1 10*3/uL (ref 0.1–1.0)
MONOS PCT: 9 %
NEUTROS PCT: 59 %
Neutro Abs: 6.2 10*3/uL (ref 1.7–7.7)
Platelets: 213 10*3/uL (ref 150–400)
RBC: 6.05 MIL/uL — ABNORMAL HIGH (ref 4.22–5.81)
RDW: 13.5 % (ref 11.5–15.5)
WBC: 10.6 10*3/uL — ABNORMAL HIGH (ref 4.0–10.5)

## 2017-10-26 LAB — COMPREHENSIVE METABOLIC PANEL
ALBUMIN: 4 g/dL (ref 3.5–5.0)
ALK PHOS: 79 U/L (ref 38–126)
ALT: 34 U/L (ref 0–44)
AST: 23 U/L (ref 15–41)
Anion gap: 7 (ref 5–15)
BUN: 15 mg/dL (ref 6–20)
CALCIUM: 9.1 mg/dL (ref 8.9–10.3)
CO2: 28 mmol/L (ref 22–32)
CREATININE: 0.8 mg/dL (ref 0.61–1.24)
Chloride: 102 mmol/L (ref 98–111)
GFR calc non Af Amer: >60 mL/min (ref >60)
GLUCOSE: 183 mg/dL — AB (ref 70–99)
Potassium: 4.3 mmol/L (ref 3.5–5.1)
SODIUM: 137 mmol/L (ref 135–145)
Total Bilirubin: 0.7 mg/dL (ref 0.3–1.2)
Total Protein: 7.2 g/dL (ref 6.5–8.1)

## 2017-10-26 LAB — IRON AND TIBC
Iron: 78 ug/dL (ref 45–182)
Saturation Ratios: 24 % (ref 17.9–39.5)
TIBC: 325 ug/dL (ref 250–450)
UIBC: 247 ug/dL

## 2017-10-26 LAB — FERRITIN: Ferritin: 256 ng/mL (ref 24–336)

## 2017-10-26 LAB — LACTATE DEHYDROGENASE: LDH: 130 U/L (ref 98–192)

## 2017-10-26 NOTE — Telephone Encounter (Signed)
OK to send.

## 2017-10-26 NOTE — Progress Notes (Signed)
Referring Physician:  Dr. Dennard Schaumann  Diagnosis Polycythemia, secondary - Plan: CBC with Differential/Platelet, Comprehensive metabolic panel, Lactate dehydrogenase, Ferritin, Iron and TIBC, Transferrin Saturation, JAK2 genotypr, BCR-ABL1, CML/ALL, PCR, QUANT, Hemochromatosis DNA-PCR(c282y,h63d)  Tobacco use - Plan: CBC with Differential/Platelet, Comprehensive metabolic panel, Lactate dehydrogenase, Ferritin, Iron and TIBC, Transferrin Saturation, JAK2 genotypr, BCR-ABL1, CML/ALL, PCR, QUANT, Hemochromatosis DNA-PCR(c282y,h63d), CT CHEST LUNG CA SCREEN LOW DOSE W/O CM  Staging Cancer Staging No matching staging information was found for the patient.  Assessment and Plan: 1.  Polycythemia.  Patient had an elevated hematocrit going back to 2016.  He has a long smoking history and I suspect the etiology of his polycythemia is related to smoking.  He will undergo evaluation with iron studies, Jak 2, BCR/Abl,  hemochromatosis gene evaluation.  CBC was repeated today 10/26/2017  and showed white count 10.6 hemoglobin 18.4 hematocrit 54 platelets 213,000.  He has a normal differential with a normal neutrophil count.  Chemistries within normal limits creatinine 0.8 liver function tests are normal.  LDH is normal at 130.  He will return to clinic in 2 weeks to go over the results of his lab studies.  All questions answered and the patient expressed understanding of the information presented.  2.  Smoking.  Patient has more than 30-year history of smoking.  Cessation is recommended.  He will be set up for CT of the chest for lung cancer screening evaluation.  3.  Respiratory symptoms.  Pt reports mild SOB. Pulse ox is 97% on room air.  He is set up for CT chest for lung cancer screening evaluation due to long smoking history.    4.  Diabetes.  Patient should follow-up with PCP.  5.  Health maintenance.  Smoking cessation is recommended.  Patient is set up for CT of the chest for lung cancer screening  evaluation.  He should follow-up with PCP and GI as recommended.   HPI: 57 year old Farmer referred for evaluation of polycythemia.  Review of records show recent labs were done 10/15/2017 and his white count was 10.6 hemoglobin 19.4 platelets 266,000.  He had a slightly elevated neutrophil count.  Chemistries were within normal limits with a creatinine of 0.84 liver function tests were normal.  Review of records show that labs dating back to 2016 showed an elevated hematocrit with blood work done 01/08/2015 showing a white count 10 hemoglobin 17.7 platelets 51.6 platelets 245,000.  He denies any headaches or blurred vision.  Denies any family history of hematological disorders.  He has a long smoking history smoking for more than 30 years.  Patient is seen today for consultation due to polycythemia.  Problem List Patient Active Problem List   Diagnosis Date Noted  . HLD (hyperlipidemia) [E78.5]   . Polycythemia [D75.1]   . Diabetes mellitus type 2 in nonobese (HCC) [E11.9]   . Smoking [F17.200]     Past Medical History Past Medical History:  Diagnosis Date  . Diabetes mellitus type 2 in nonobese (HCC)   . HLD (hyperlipidemia)   . Hypertension   . Polycythemia   . Smoking     Past Surgical History History reviewed. No pertinent surgical history.  Family History History reviewed. No pertinent family history.   Social History  reports that he has been smoking cigarettes.  He has a 18.50 pack-year smoking history. He has never used smokeless tobacco. He reports that he does not drink alcohol or use drugs.  Medications  Current Outpatient Medications:  .  aspirin 81 MG tablet,  Take 81 mg by mouth daily., Disp: , Rfl:  .  atorvastatin (LIPITOR) 20 MG tablet, Take 1 tablet (20 mg total) by mouth daily., Disp: 90 tablet, Rfl: 3 .  lisinopril-hydrochlorothiazide (PRINZIDE,ZESTORETIC) 20-12.5 MG tablet, Take 1 tablet by mouth daily., Disp: 30 tablet, Rfl: 11 .  metFORMIN (GLUCOPHAGE) 500 MG  tablet, Take 1 tablet (500 mg total) by mouth 2 (two) times daily with a meal. In 2 weeks, Increase to 1053m by mouth 2(two) times daily, Disp: 150 tablet, Rfl: 3  Allergies Bee venom and Other  Review of Systems Review of Systems - Oncology ROS negative other than breathing issues,    Physical Exam  Vitals Wt Readings from Last 3 Encounters:  10/26/17 211 lb 3.2 oz (95.8 kg)  10/15/17 212 lb (96.2 kg)  06/19/17 210 lb (95.3 kg)   Temp Readings from Last 3 Encounters:  10/26/17 98.3 F (36.8 C) (Oral)  10/15/17 98.1 F (36.7 C) (Oral)  06/19/17 98.2 F (36.8 C) (Oral)   BP Readings from Last 3 Encounters:  10/26/17 114/76  10/15/17 128/80  06/19/17 (!) 144/80   Pulse Readings from Last 3 Encounters:  10/26/17 72  10/15/17 70  06/19/17 86   Constitutional: Well-developed, well-nourished, and in no distress.   HENT: Head: Normocephalic and atraumatic.  Mouth/Throat: No oropharyngeal exudate. Mucosa moist. Eyes: Pupils are equal, round, and reactive to light. Conjunctivae are normal. No scleral icterus.  Neck: Normal range of motion. Neck supple. No JVD present.  Cardiovascular: Normal rate, regular rhythm and normal heart sounds.  Exam reveals no gallop and no friction rub.   No murmur heard. Pulmonary/Chest: Effort normal and breath sounds normal. No respiratory distress. No wheezes.No rales.  Abdominal: Soft. Bowel sounds are normal. No distension. There is no tenderness. There is no guarding.  Musculoskeletal: No edema or tenderness.  Lymphadenopathy: No cervical, axillary or supraclavicular adenopathy.  Neurological: Alert and oriented to person, place, and time. No cranial nerve deficit.  Skin: Skin is warm and dry. No rash noted. No erythema. No pallor.  Psychiatric: Affect and judgment normal.   Labs Appointment on 10/26/2017  Component Date Value Ref Range Status  . WBC 10/26/2017 10.6* 4.0 - 10.5 K/uL Final  . RBC 10/26/2017 6.05* 4.22 - 5.81 MIL/uL  Final  . Hemoglobin 10/26/2017 18.4* 13.0 - 17.0 g/dL Final  . HCT 10/26/2017 54.1* 39.0 - 52.0 % Final  . MCV 10/26/2017 89.4  78.0 - 100.0 fL Final  . MCH 10/26/2017 30.4  26.0 - 34.0 pg Final  . MCHC 10/26/2017 34.0  30.0 - 36.0 g/dL Final  . RDW 10/26/2017 13.5  11.5 - 15.5 % Final  . Platelets 10/26/2017 213  150 - 400 K/uL Final  . Neutrophils Relative % 10/26/2017 59  % Final  . Neutro Abs 10/26/2017 6.2  1.7 - 7.7 K/uL Final  . Lymphocytes Relative 10/26/2017 31  % Final  . Lymphs Abs 10/26/2017 3.3  0.7 - 4.0 K/uL Final  . Monocytes Relative 10/26/2017 9  % Final  . Monocytes Absolute 10/26/2017 1.0  0.1 - 1.0 K/uL Final  . Eosinophils Relative 10/26/2017 1  % Final  . Eosinophils Absolute 10/26/2017 0.1  0.0 - 0.7 K/uL Final  . Basophils Relative 10/26/2017 0  % Final  . Basophils Absolute 10/26/2017 0.0  0.0 - 0.1 K/uL Final   Performed at [facility] [address], [place] [address] . Sodium 10/26/2017 137  135 - 145 mmol/L Final  . Potassium 10/26/2017  4.3  3.5 - 5.1 mmol/L Final  . Chloride 10/26/2017 102  98 - 111 mmol/L Final  . CO2 10/26/2017 28  22 - 32 mmol/L Final  . Glucose, Bld 10/26/2017 183* 70 - 99 mg/dL Final  . BUN 10/26/2017 15  6 - 20 mg/dL Final  . Creatinine, Ser 10/26/2017 0.80  0.61 - 1.24 mg/dL Final  . Calcium 10/26/2017 9.1  8.9 - 10.3 mg/dL Final  . Total Protein 10/26/2017 7.2  6.5 - 8.1 g/dL Final  . Albumin 10/26/2017 4.0  3.5 - 5.0 g/dL Final  . AST 10/26/2017 23  15 - 41 U/L Final  . ALT 10/26/2017 34  0 - 44 U/L Final  . Alkaline Phosphatase 10/26/2017 79  38 - 126 U/L Final  . Total Bilirubin 10/26/2017 0.7  0.3 - 1.2 mg/dL Final  . GFR calc non Af Amer 10/26/2017 >60  >60 mL/min Final  . GFR calc Af Amer 10/26/2017 >60  >60 mL/min Final   Comment: (NOTE) The eGFR has been calculated using the CKD EPI equation. This calculation has not been validated in all clinical situations. eGFR's persistently <60 mL/min signify  possible Chronic Kidney Disease.   Georgiann Hahn gap 10/26/2017 7  5 - 15 Final   Performed at [facility], [address], [place], [place] [address]  . LDH 10/26/2017 130  98 - 192 U/L Final   Performed at [facility], [address], [place], [place] [address]     Pathology Orders Placed This Encounter  Procedures  . CT CHEST LUNG CA SCREEN LOW DOSE W/O CM    Standing Status:   Future    Standing Expiration Date:   12/28/2018    Order Specific Question:   Reason for Exam (SYMPTOM  OR DIAGNOSIS REQUIRED)    Answer:   lung cancer screening    Order Specific Question:   Preferred Imaging Location?    Answer:   [facility]    Order Specific Question:   Radiology Contrast Protocol - do NOT remove file path    Answer:   [email or web address]  . CBC with Differential/Platelet    Standing Status:   Future    Number of Occurrences:   1    Standing Expiration Date:   10/27/2018  . Comprehensive metabolic panel    Standing Status:   Future    Number of Occurrences:   1    Standing Expiration Date:   10/27/2018  . Lactate dehydrogenase    Standing Status:   Future    Number of Occurrences:   1    Standing Expiration Date:   10/27/2018  . Ferritin    Standing Status:   Future    Number of Occurrences:   1    Standing Expiration Date:   10/27/2018  . Iron and TIBC    Standing Status:   Future    Number of Occurrences:   1    Standing Expiration Date:   10/27/2018  . Transferrin Saturation    Standing Status:   Future    Number of Occurrences:   1    Standing Expiration Date:   10/27/2018  . JAK2 genotypr    Standing Status:   Future    Number of Occurrences:   1    Standing Expiration Date:   10/27/2018  . BCR-ABL1, CML/ALL, PCR, QUANT    Standing Status:   Future    Number of Occurrences:   1    Standing Expiration Date:  10/27/2018  . Hemochromatosis DNA-PCR(c282y,h63d)    Standing Status:   Future    Number of Occurrences:   1    Standing Expiration  Date:   10/27/2018       Zoila Shutter MD

## 2017-10-26 NOTE — Telephone Encounter (Signed)
Pt came in for labs and states that he would like to go ahead and start medication for smoking since dr. Dennard Schaumann has been pushing for him to quit. [email or web address] [address]

## 2017-10-26 NOTE — Patient Instructions (Signed)
[place] at [facility] Discharge Instructions  You saw Dr. Walden Field today.   Thank you for choosing [place] at [facility] to provide your oncology and hematology care.  To afford each patient quality time with our provider, please arrive at least 15 minutes before your scheduled appointment time.   If you have a lab appointment with the [facility] please come in thru the  Main Entrance and check in at the main information desk  You need to re-schedule your appointment should you arrive 10 or more minutes late.  We strive to give you quality time with our providers, and arriving late affects you and other patients whose appointments are after yours.  Also, if you no show three or more times for appointments you may be dismissed from the clinic at the providers discretion.     Again, thank you for choosing [facility].  Our hope is that these requests will decrease the amount of time that you wait before being seen by our physicians.       _____________________________________________________________  Should you have questions after your visit to [facility], please contact our office at [phone] between the hours of 8:00 a.m. and 4:30 p.m.  Voicemails left after 4:00 p.m. will not be returned until the following business day.  For prescription refill requests, have your pharmacy contact our office and allow 72 hours.    Cancer Center Support Programs:   > Cancer Support Group  2nd Tuesday of the month 1pm-2pm, Journey Room    Polycythemia Vera Polycythemia vera (PV), or myeloproliferative disease, is a form of blood cancer in which the bone marrow makes too many (overproduces) red blood cells. The bone marrow may also make too many clotting cells (platelets) and white blood cells. Bone marrow is the spongy center of bones where blood cells are produced. Sometimes, there may be an overproduction of blood cells  in the liver and spleen, causing those organs to become enlarged. Additionally, people who have PV are at a higher risk for stroke or heart attack because their blood may clot more easily. PV is a long-term disease. What are the causes? Almost all people who have PV have an abnormal gene (genetic mutation) that causes changes in the way that the bone marrow makes blood cells. This gene, which is called JAK2, is not passed along from parent to child (is not hereditary). It is not known what triggers the genetic mutation that causes the body to produce too many red blood cells. What increases the risk? This condition is more likely to develop in:  Males.  People who are 27 years of age or older.  What are the signs or symptoms? You may not have any symptoms in the early stage of PV. When symptoms develop, they may include:  Shortness of breath.  Dizziness.  Hot and flushed skin.  Itchy skin.  Sweats, especially night sweats.  Headache.  Tiredness.  Ringing in the ears.  Blurred vision or blind spots.  Bone pain.  Weight loss.  Fever.  Blood-tinged vomit or bowel movements.  How is this diagnosed? This condition may be diagnosed during a routine physical exam if you have a blood test called a complete blood count (CBC). Your health care provider also may suspect PV if you have symptoms. During the physical exam, your provider may find that you have an enlarged liver or spleen. You may also have tests to  confirm the diagnosis. These may include:  A procedure to remove a sample of bone marrow for testing (bone marrow biopsy).  Blood tests to check for: ? The JAK2 gene. ? Low levels of a hormone that helps to regulate blood production (erythropoietin).  How is this treated? There is no cure for PV, but treatment can help to control the disease. There are several types of treatment. No single treatment works for everyone. You will need to work with a blood cancer specialist  (hematologist) to find the treatment that is best for you. Options include:  Periodically having some blood removed with a needle (drawn) to lower the number of red blood cells (phlebotomy).  Medicine. Your health care provider may recommend: ? Low-dose aspirin to lower your risk for blood clots. ? A medicine to reduce red blood cell production (hydroxyurea). ? A medicine to lower the number of red blood cells (interferon). ? A medicine that slows down the effects of JAK2 (ruxolitinib).  Follow these instructions at home:  Take over-the-counter and prescription medicines only as told by your health care provider.  Return to your normal activities as told by your health care provider. Ask your health care provider what activities are safe for you.  Do not use tobacco products, including cigarettes, chewing tobacco, or e-cigarettes. If you need help quitting, ask your health care provider.  Keep all follow-up visits as told by your health care provider. This is important. Contact a health care provider if:  You have side effects from your medicines.  Your symptoms change or get worse at home.  You have blood in your stool or you vomit blood. Get help right away if:  You have sudden and severe pain in your abdomen.  You have chest pain or difficulty breathing.  You have signs of stroke, such as: ? Sudden numbness. ? Weakness of your face or arm. ? Confusion. ? Difficulty speaking or understanding speech. These symptoms may represent a serious problem that is an emergency. Do not wait to see if the symptoms will go away. Get medical help right away. Call your local emergency services (911 in the [place] Do not drive yourself to the hospital. This information is not intended to replace advice given to you by your health care provider. Make sure you discuss any questions you have with your health care provider. Document Released: 12/13/2000 Document Revised: 08/26/2015 Document  Reviewed: 09/30/2014 Elsevier Interactive Patient Education  [employer].

## 2017-10-30 LAB — BCR-ABL1, CML/ALL, PCR, QUANT

## 2017-10-30 LAB — JAK2 GENOTYPR

## 2017-11-02 LAB — HEMOCHROMATOSIS DNA-PCR(C282Y,H63D)

## 2017-11-04 LAB — JAK2,EXON 12 MUTATION ANALYSIS: JAK2 Exon 12 Mutation: NOT DETECTED

## 2017-11-13 ENCOUNTER — Ambulatory Visit (HOSPITAL_COMMUNITY): Payer: BLUE CROSS/BLUE SHIELD

## 2017-11-16 ENCOUNTER — Encounter (HOSPITAL_COMMUNITY): Payer: Self-pay | Admitting: Internal Medicine

## 2017-11-16 ENCOUNTER — Inpatient Hospital Stay (HOSPITAL_COMMUNITY): Payer: BLUE CROSS/BLUE SHIELD | Attending: Internal Medicine | Admitting: Internal Medicine

## 2017-11-16 VITALS — BP 127/78 | HR 70 | Temp 98.1°F | Resp 14 | Wt 212.2 lb

## 2017-11-16 DIAGNOSIS — E119 Type 2 diabetes mellitus without complications: Secondary | ICD-10-CM

## 2017-11-16 DIAGNOSIS — Z7982 Long term (current) use of aspirin: Secondary | ICD-10-CM | POA: Diagnosis not present

## 2017-11-16 DIAGNOSIS — Z79899 Other long term (current) drug therapy: Secondary | ICD-10-CM | POA: Diagnosis not present

## 2017-11-16 DIAGNOSIS — F17210 Nicotine dependence, cigarettes, uncomplicated: Secondary | ICD-10-CM

## 2017-11-16 DIAGNOSIS — I10 Essential (primary) hypertension: Secondary | ICD-10-CM | POA: Diagnosis not present

## 2017-11-16 DIAGNOSIS — D751 Secondary polycythemia: Secondary | ICD-10-CM | POA: Insufficient documentation

## 2017-11-16 DIAGNOSIS — E785 Hyperlipidemia, unspecified: Secondary | ICD-10-CM | POA: Diagnosis not present

## 2017-11-16 NOTE — Progress Notes (Signed)
Diagnosis Polycythemia, secondary - Plan: CBC with Differential/Platelet, Comprehensive metabolic panel, Lactate dehydrogenase, Ferritin  Staging Cancer Staging No matching staging information was found for the patient.  Assessment and Plan:  1.  Secondary Polycythemia.  Patient had an elevated hematocrit going back to 2016.  He has a long smoking history.     Labs done 10/26/2017 reviewed and showed white count 10.6 hemoglobin 18.4 hematocrit 54 platelets 213,000.  He has a normal differential with a normal neutrophil count.  Chemistries within normal limits creatinine 0.8 liver function tests are normal.  LDH is normal at 130.  Ferritin is 256 with % saturation of 24.  He has a negative Jak 2, BCR/Abl,  hemochromatosis gene evaluation.  I have discussed with pt he had a negative work-up with polycythemia likely due to history of smoking.    2.  Smoking.  Patient has more than 30-year history of smoking.  Cessation is recommended.  He reports he is being placed on medications by PCP to help with smoking cessation.  I discussed with him option of CT of the chest for lung cancer screening evaluation.  He will discuss this further with PCP.    3.  Diabetes. Follow-up with PCP.  Hemochromatosis evaluation is negative.    4.  Health maintenance.  Smoking cessation is recommended.  He reports he is being placed on medications by PCP to help with smoking cessation.  I discussed with him option of CT of the chest for lung cancer screening evaluation.  He will discuss this further with PCP.  GI follow-up as recommended.    Interval History:  Historical data obtained from the note dated 10/26/2017.  57 year old male referred for evaluation of polycythemia.  Review of records show recent labs were done 10/15/2017 and his white count was 10.6 hemoglobin 19.4 platelets 266,000.  He had a slightly elevated neutrophil count.  Chemistries were within normal limits with a creatinine of 0.84 liver function tests  were normal.  Review of records show that labs dating back to 2016 showed an elevated hematocrit with blood work done 01/08/2015 showing a white count 10 hemoglobin 17.7 platelets 51.6 platelets 245,000.  He denies any headaches or blurred vision.  Denies any family history of hematological disorders.  He has a long smoking history smoking for more than 30 years.   Current Status:  Pt is seen today for follow-up.  He is here to go over labs.    Problem List Patient Active Problem List   Diagnosis Date Noted  . HLD (hyperlipidemia) [E78.5]   . Polycythemia [D75.1]   . Diabetes mellitus type 2 in nonobese (HCC) [E11.9]   . Smoking [F17.200]     Past Medical History Past Medical History:  Diagnosis Date  . Diabetes mellitus type 2 in nonobese (HCC)   . HLD (hyperlipidemia)   . Hypertension   . Polycythemia   . Smoking     Past Surgical History History reviewed. No pertinent surgical history.  Family History History reviewed. No pertinent family history.   Social History  reports that he has been smoking cigarettes. He has a 18.50 pack-year smoking history. He has never used smokeless tobacco. He reports that he does not drink alcohol or use drugs.  Medications  Current Outpatient Medications:  .  aspirin 81 MG tablet, Take 81 mg by mouth daily., Disp: , Rfl:  .  atorvastatin (LIPITOR) 20 MG tablet, Take 1 tablet (20 mg total) by mouth daily., Disp: 90 tablet, Rfl: 3 .  lisinopril-hydrochlorothiazide (PRINZIDE,ZESTORETIC) 20-12.5 MG tablet, Take 1 tablet by mouth daily., Disp: 30 tablet, Rfl: 11 .  metFORMIN (GLUCOPHAGE) 500 MG tablet, Take 1 tablet (500 mg total) by mouth 2 (two) times daily with a meal. In 2 weeks, Increase to 1048m by mouth 2(two) times daily, Disp: 150 tablet, Rfl: 3  Allergies Bee venom and Other  Review of Systems Review of Systems - Oncology ROS negative   Physical Exam  Vitals Wt Readings from Last 3 Encounters:  11/16/17 212 lb 3.2 oz (96.3  kg)  10/26/17 211 lb 3.2 oz (95.8 kg)  10/15/17 212 lb (96.2 kg)   Temp Readings from Last 3 Encounters:  11/16/17 98.1 F (36.7 C) (Oral)  10/26/17 98.3 F (36.8 C) (Oral)  10/15/17 98.1 F (36.7 C) (Oral)   BP Readings from Last 3 Encounters:  11/16/17 127/78  10/26/17 114/76  10/15/17 128/80   Pulse Readings from Last 3 Encounters:  11/16/17 70  10/26/17 72  10/15/17 70    Constitutional: Well-developed, well-nourished, and in no distress.   HENT: Head: Normocephalic and atraumatic.  Mouth/Throat: No oropharyngeal exudate. Mucosa moist. Eyes: Pupils are equal, round, and reactive to light. Conjunctivae are normal. No scleral icterus.  Neck: Normal range of motion. Neck supple. No JVD present.  Cardiovascular: Normal rate, regular rhythm and normal heart sounds.  Exam reveals no gallop and no friction rub.   No murmur heard. Pulmonary/Chest: Effort normal and breath sounds normal. No respiratory distress. No wheezes.No rales.  Abdominal: Soft. Bowel sounds are normal. No distension. There is no tenderness. There is no guarding.  Musculoskeletal: No edema or tenderness.  Lymphadenopathy: No cervical, axillary or supraclavicular adenopathy.  Neurological: Alert and oriented to person, place, and time. No cranial nerve deficit.  Skin: Skin is warm and dry. No rash noted. No erythema. No pallor.  Psychiatric: Affect and judgment normal.   Labs No visits with results within 3 Day(s) from this visit.  Latest known visit with results is:  Appointment on 10/26/2017  Component Date Value Ref Range Status  . WBC 10/26/2017 10.6* 4.0 - 10.5 K/uL Final  . RBC 10/26/2017 6.05* 4.22 - 5.81 MIL/uL Final  . Hemoglobin 10/26/2017 18.4* 13.0 - 17.0 g/dL Final  . HCT 10/26/2017 54.1* 39.0 - 52.0 % Final  . MCV 10/26/2017 89.4  78.0 - 100.0 fL Final  . MCH 10/26/2017 30.4  26.0 - 34.0 pg Final  . MCHC 10/26/2017 34.0  30.0 - 36.0 g/dL Final  . RDW 10/26/2017 13.5  11.5 - 15.5 % Final   . Platelets 10/26/2017 213  150 - 400 K/uL Final  . Neutrophils Relative % 10/26/2017 59  % Final  . Neutro Abs 10/26/2017 6.2  1.7 - 7.7 K/uL Final  . Lymphocytes Relative 10/26/2017 31  % Final  . Lymphs Abs 10/26/2017 3.3  0.7 - 4.0 K/uL Final  . Monocytes Relative 10/26/2017 9  % Final  . Monocytes Absolute 10/26/2017 1.0  0.1 - 1.0 K/uL Final  . Eosinophils Relative 10/26/2017 1  % Final  . Eosinophils Absolute 10/26/2017 0.1  0.0 - 0.7 K/uL Final  . Basophils Relative 10/26/2017 0  % Final  . Basophils Absolute 10/26/2017 0.0  0.0 - 0.1 K/uL Final   [facility] [address] [place] [id number] . Sodium 10/26/2017 137  135 - 145 mmol/L Final  . Potassium 10/26/2017 4.3  3.5 - 5.1 mmol/L Final  . Chloride 10/26/2017 102  98 - 111 mmol/L Final  .  CO2 10/26/2017 28  22 - 32 mmol/L Final  . Glucose, Bld 10/26/2017 183* 70 - 99 mg/dL Final  . BUN 10/26/2017 15  6 - 20 mg/dL Final  . Creatinine, Ser 10/26/2017 0.80  0.61 - 1.24 mg/dL Final  . Calcium 10/26/2017 9.1  8.9 - 10.3 mg/dL Final  . Total Protein 10/26/2017 7.2  6.5 - 8.1 g/dL Final  . Albumin 10/26/2017 4.0  3.5 - 5.0 g/dL Final  . AST 10/26/2017 23  15 - 41 U/L Final  . ALT 10/26/2017 34  0 - 44 U/L Final  . Alkaline Phosphatase 10/26/2017 79  38 - 126 U/L Final  . Total Bilirubin 10/26/2017 0.7  0.3 - 1.2 mg/dL Final  . GFR calc non Af Amer 10/26/2017 >60  >60 mL/min Final  . GFR calc Af Amer 10/26/2017 >60  >60 mL/min Final   Comment: (NOTE) The eGFR has been calculated using the CKD EPI equation. This calculation has not been validated in all clinical situations. eGFR's persistently <60 mL/min signify possible Chronic Kidney Disease.   Georgiann Hahn gap 10/26/2017 7  5 - 15 Final   Performed at [facility], [address], [place], [place] [address]  . LDH 10/26/2017 130  98 - 192 U/L Final   Performed at [facility], [address], [place], [place] [address]  . Ferritin 10/26/2017 256  24 -  336 ng/mL Final   Performed at [facility], [place] [address], [place], [place] [address]  . Iron 10/26/2017 78  45 - 182 ug/dL Final  . TIBC 10/26/2017 325  250 - 450 ug/dL Final  . Saturation Ratios 10/26/2017 24  17.9 - 39.5 % Final  . UIBC 10/26/2017 247  ug/dL Final   Performed at [facility] [address] [place], [place] [address]  . JAK2 GenotypR 10/26/2017 Comment   Final   Comment: (NOTE) Result: NEGATIVE for the JAK2 V617F mutation. Interpretation:  The G to T nucleotide change encoding the V617F mutation was not detected.  This result does not rule out the presence of the JAK2 mutation at a level below the sensitivity of detection of this assay, or the presence of other mutations within JAK2 not detected by this assay.  This result does not rule out a diagnosis of polycythemia vera, essential thrombocythemia or idiopathic myelofibrosis as the V617F mutation is not detected in all patients with these disorders.   . Director Review, JAK2 10/26/2017 Comment   Corrected   Comment: (NOTE) Katina Degree, MD, PhD Director, [place] for [place] and [place], [place] [address] [phone] This test was developed and its performance characteristics determined by [facility]. It has not been cleared or approved by the Food and Drug Administration. Performed At: [facility] [address] [place], [place] [id number] Nechama Guard MD YO:[id number] Performed At: [facility] [place], [place] [id number] Nechama Guard MD EH:[id number]   . BACKGROUND: 10/26/2017 Comment   Corrected   Comment: (NOTE) JAK2 is a cytoplasmic tyrosine kinase with a key role in signal transduction from multiple hematopoietic growth factor receptors. A point mutation within exon 14 of the JAK2 gene ([id number]) encoding a valine to phenylalanine substitution at position 617 of the JAK2 protein (V617F) has been  identified in most patients with polycythemia vera, and in about half of those with either essential thrombocythemia or idiopathic myelofibrosis. The V617F has also been detected, although infrequently, in other myeloid  disorders such as chronic myelomonocytic leukemia and chronic neutrophilic luekemia. V617F is an acquired mutation that alters a highly conserved valine present in the negative regulatory JH2 domain of the JAK2 protein and is predicted to dysregulate kinase activity. Methodology: Total genomic DNA was extracted and subjected to TaqMan real-time PCR amplification/detection. Two amplification products per sample were monitored by real-time PCR using primers/probes s                          pecific to JAK2 wild type (WT) and JAK2 mutant V617F. The ABI7900 Absolute Quantitation software will compare the patient specimen valuse to the standard curves and generate percent values for wild type and mutant type. In vitro studies have indicated that this assay has an analytical sensitivity of 1%. References: Baxter EJ, Scott Phineas Real, et al. Acquired mutation of the tyrosine kinase JAK2 in human myeloproliferative disorders. Lancet. 2005 Mar 19-25; 365(9464):1054-1061. Alfonso Ramus Couedic JP. A unique clonal JAK2 mutation leading to constitutive signaling causes polycythaemia vera. Nature. 2005 Apr 28; 434(7037):1144-1148. Kralovics R, Passamonti F, Buser AS, et al. A gain-of-function mutation of JAK2 in myeloproliferative disorders. N Engl J Med. 2005 Apr 28; 352(17):1779-1790.   . b2a2 transcript 10/26/2017 Comment  % Final   Comment: (NOTE)           <0.0032 % (sensitivity limit of assay)   . b3a2 transcript 10/26/2017 Comment  % Final   Comment: (NOTE)           <0.0032 % (sensitivity limit of assay)   . E1A2 Transcript 10/26/2017 Comment  % Final   Comment: (NOTE)           <0.0032 % (sensitivity limit of assay)   . Interpretation (BCRAL):  10/26/2017 Comment   Final   Comment: (NOTE) NEGATIVE for the BCR-ABL1 e1a2 (p190), e13a2 (b2a2, p210) and e14a2 (b3a2, p210) fusion transcripts. These results do not rule out the presence of rare BCR-ABL1 transcripts not detected by this assay.   . Director Review (BCRAL): 10/26/2017 Comment   Final   Comment: (NOTE) Katina Degree, MD, PhD Director, [place] for [facility] and Pathology [place], [place] [address] [phone]   . Background: 10/26/2017 Comment   Corrected   Comment: (NOTE) This assay can detect three different types of BCR-ABL1 fusion transcripts associated with CML, ALL, and AML: e13a2 (previously b2a2) and e14a2 (previously b3a2) (major breakpoint, p210), as well as e1a2 (minor breakpoint, p190). The e13a2 and e14a2 transcript values are titrated to the current International Scale (IS). The standardized baseline is 100% BCR-ABL1 (IS) and major molecular response (MMR) is equivalent to 0.1% BCR-ABL1 (IS) corresponding to a 3-log reduction. Results should be correlated with appropriate clinical and laboratory information as indicated.   . Methodology 10/26/2017 Comment   Corrected   Comment: (NOTE) Total RNA is isolated from the sample and subject to a real-time, reverse transcriptase polymerase chain reaction (RT-PCR). The PCR primers and probes are specific for BCR-ABL1 e13a2, e14a2 and e1a2 fusion transcripts. The ABL1 transcript is amplified as the control for cDNA quantity and quality. Serial dilutions of a validated positive control RNA with known t(9;22) BCR-ABL1 are used as reference for quantification of BCR-ABL1 relative to ABL1. The numeric BCR-ABL1 level is reportd as % BCR-ABL1/ABL1 and the detection sensitivity is 4.5 log below the standard baseline. This test was developed and its performance characteristics determined by [facility]. It has not been cleared or approved  by the Food and Drug  Administration. References:    1. Anastasia Fiedler and Branford S: Seminars in Hematology 2003;       40 (suppl2):62-68.    2. White HE, et al. Blood 2010; 116: e111-117.    3. NCCN Clinical Practice Guidelines in Oncology, Chronic       Myeloid Leukemia. V2. 2017.                           Performed At: [facility] [address] [place], [place] [id number] Nechama Guard MD EM:[id number] Performed At: [facility] [address], [place] [id number] Nechama Guard MD [id number]   . DNA Mutation Analysis 10/26/2017 Comment   Final   Comment: (NOTE) NO MUTATION IDENTIFIED Interpretation: This patient's sample was analyzed for the hereditary hemochromatosis (HH) mutations C282Y, H63D, S65C. No mutation was identified. The mutations analyzed by [facility] are most common in the Caucasian population, and up to 90% of affected Caucasians will have a positive test result. Because this panel does not identify rare HH mutations or HH mutations found in other ethnic groups, there are a small number of people who may have a negative test but may actually be affected. The diagnosis of HH should include clinical findings and other test results such as transferrin-iron saturation and/or serum ferritin studies and/or liver biopsy.  If this patient has a history of HH, in many cases a specific carrier risk can be determined based on this negative result. Methodology: DNA Analysis of the HFE gene was performed by PCR amplification followed by restriction enzyme digestion analyses. Reference: Cathe Mons and Walker AP. (20                          00). Genet Test 4:97-101. Cogswell ME et al. (1999). AM J Prev Med 16:134-140. Bomford A (2002). Lancet 360(9346):1673-81. Stan Head et al. (2002). Blood Cells, Molecules. and Diseases.  29(3):418-432. Imperatore G et al. (2003). Genet Med. 5(1):1-8. Cogswell ME et al. (2003). Genet Med. 5(4):304-10. This test was developed  and its performance characteristics determined by [facility]. It has not been cleared or approved by the Food and Drug Administration. Genetic counselors are available for health care providers to discuss results at 1-800-345-GENE. Allison Quarry, PhD, [facility] Ruben Reason, PhD, [facility] Annetta Maw, M.S., PhD, [facility] Alfredo Bach, PhD, [facility] Norva Riffle, PhD, [facility] Earlean Polka, PhD, [facility] Performed At: [facility] [place], [place] [id number] Nechama Guard MD [id number]      Pathology Orders Placed This Encounter  Procedures  . CBC with Differential/Platelet    Standing Status:   Future    Standing Expiration Date:   11/17/2019  . Comprehensive metabolic panel    Standing Status:   Future    Standing Expiration Date:   11/17/2019  . Lactate dehydrogenase    Standing Status:   Future    Standing Expiration Date:   11/17/2019  . Ferritin    Standing Status:   Future    Standing Expiration Date:   11/17/2019       Zoila Shutter MD

## 2017-11-16 NOTE — Patient Instructions (Signed)
[facility] at [facility] Discharge Instructions  You saw Dr. Higgs today.   Thank you for choosing [facility] at [facility] to provide your oncology and hematology care.  To afford each patient quality time with our provider, please arrive at least 15 minutes before your scheduled appointment time.   If you have a lab appointment with the Cancer Center please come in thru the  Main Entrance and check in at the main information desk  You need to re-schedule your appointment should you arrive 10 or more minutes late.  We strive to give you quality time with our providers, and arriving late affects you and other patients whose appointments are after yours.  Also, if you no show three or more times for appointments you may be dismissed from the clinic at the providers discretion.     Again, thank you for choosing [facility].  Our hope is that these requests will decrease the amount of time that you wait before being seen by our physicians.       _____________________________________________________________  Should you have questions after your visit to [facility], please contact our office at [phone] between the hours of 8:00 a.m. and 4:30 p.m.  Voicemails left after 4:00 p.m. will not be returned until the following business day.  For prescription refill requests, have your pharmacy contact our office and allow 72 hours.    Cancer Center Support Programs:   > Cancer Support Group  2nd Tuesday of the month 1pm-2pm, Journey Room    

## 2017-11-23 ENCOUNTER — Encounter: Payer: Self-pay | Admitting: Family Medicine

## 2017-11-23 ENCOUNTER — Ambulatory Visit: Payer: BLUE CROSS/BLUE SHIELD | Admitting: Family Medicine

## 2017-11-23 VITALS — BP 142/84 | HR 76 | Temp 98.4°F | Resp 16 | Ht 69.0 in | Wt 213.0 lb

## 2017-11-23 DIAGNOSIS — E119 Type 2 diabetes mellitus without complications: Secondary | ICD-10-CM | POA: Diagnosis not present

## 2017-11-23 DIAGNOSIS — F17200 Nicotine dependence, unspecified, uncomplicated: Secondary | ICD-10-CM | POA: Diagnosis not present

## 2017-11-23 DIAGNOSIS — I10 Essential (primary) hypertension: Secondary | ICD-10-CM

## 2017-11-23 DIAGNOSIS — E7800 Pure hypercholesterolemia, unspecified: Secondary | ICD-10-CM

## 2017-11-23 DIAGNOSIS — D751 Secondary polycythemia: Secondary | ICD-10-CM

## 2017-11-23 MED ORDER — BUPROPION HCL ER (XL) 150 MG PO TB24: 150.0000 mg | ORAL_TABLET | Freq: Every day | ORAL | 3 refills | Status: DC

## 2017-11-23 NOTE — Progress Notes (Signed)
Subjective:    Patient ID: Collin Farmer, male    DOB: July 01, 1960, 57 y.o.   MRN: [id number]  Medication Refill   06/19/17 Patient coming in for refill on his blood pressure medication. He is currently taking Zestoretic 20/12.5 one by mouth daily.  Blood pressure today is marginally elevated at 144/80.  He denies any chest pain shortness of breath or dyspnea on exertion.  He continues to smoke.  He admits that he is not minimally committed to quitting at the present time.  He did not like the way Chantix made him feel the last time he tried it and he had no success on Wellbutrin.  Colonoscopy is up-to-date and is not due again until the patient turns 58.  He is due for prostate cancer screening.  He is also due for Pneumovax 23 because of his smoking history.  At that time, my plan was: Colonoscopy is up-to-date.  Continue to encourage smoking cessation.  Blood pressure today is borderline elevated.  Patient has a way of checking his blood pressure at home and would like to record his blood pressures at home over the next few weeks and let me know the values.  If greater than 970 systolic, I would increase his blood pressure medication.  Patient received Pneumovax 23 today.  He declined HIV and hepatitis C screening.  Screen for prostate cancer with a PSA  10/15/17 His labs at that time revealed a hemoglobin of 18.4 as well as a random sugar of 122.  I recommended smoking cessation and recheck a CBC in 3 months and also check hemoglobin A1c.  Patient is here today for follow-up.  At his last visit, the patient was not fasting.  Therefore he believes the sugar was a random sugar and not indicative of his glycemic control which is reasonable.  Unfortunately he continues to smoke.  He is tried and failed Wellbutrin in the past as well as Chantix.  He had better success with nicotine patches.  At the present time he is pre-contemplative.  He denies any witnessed apneic episodes or hypersomnolence during  the daytime.  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  Recheck again today.  If even higher, I would recommend a sleep study to rule out obstructive sleep apnea, and I would also check lab work to rule out Jak 2 mutation.  Continue to encourage smoking cessation.  We discussed possibly trying a combination of Wellbutrin with nicotine patches.  He will consider that.  His blood pressure is great today at 128/80.  His elevated blood sugar was likely due to eating at his last visit.  He is fasting today.  We will check a hemoglobin A1c to verify if in fact the patient is prediabetic  11/23/17 On the patient's lab work at his last visit, his hemoglobin was greater than 19.  Therefore referred him to oncology for evaluation for polycythemia.  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  He has a sleep study scheduled next month.  He is interested in smoking cessation.  He does not want to try Chantix again because he had a bad reaction to this in the past.  He is interested in trying Wellbutrin and nicotine patches.  His lab work was also significant for a blood sugar greater than 200.  Hemoglobin A1c was found to  be 9 confirming type 2 diabetes mellitus.  His LDL cholesterol was also elevated at greater than 100.  He is here today to discuss treatment options Past Medical History:  Diagnosis Date  . Diabetes mellitus type 2 in nonobese (HCC)   . HLD (hyperlipidemia)   . Hypertension   . Polycythemia   . Smoking    Current Outpatient Medications on File Prior to Visit  Medication Sig Dispense Refill  . aspirin 81 MG tablet Take 81 mg by mouth daily.    Marland Kitchen atorvastatin (LIPITOR) 20 MG tablet Take 1 tablet (20 mg total) by mouth daily. 90 tablet 3  . lisinopril-hydrochlorothiazide (PRINZIDE,ZESTORETIC) 20-12.5 MG tablet Take 1 tablet by mouth daily. 30  tablet 11  . metFORMIN (GLUCOPHAGE) 500 MG tablet Take 1 tablet (500 mg total) by mouth 2 (two) times daily with a meal. In 2 weeks, Increase to 1000mg  by mouth 2(two) times daily (Patient taking differently: Take 1,000 mg by mouth 2 (two) times daily with a meal. In 2 weeks, Increase to 1000mg  by mouth 2(two) times daily) 150 tablet 3   No current facility-administered medications on file prior to visit.    Allergies  Allergen Reactions  . Bee Venom Anaphylaxis  . Other     Bee stings   Social History   Socioeconomic History  . Marital status: Single    Spouse name: Not on file  . Number of children: Not on file  . Years of education: Not on file  . Highest education level: Not on file  Occupational History  . Not on file  Social Needs  . Financial resource strain: Not on file  . Food insecurity:    Worry: Not on file    Inability: Not on file  . Transportation needs:    Medical: Not on file    Non-medical: Not on file  Tobacco Use  . Smoking status: Current Every Day Smoker    Packs/day: 0.50    Years: 37.00    Pack years: 18.50    Types: Cigarettes  . Smokeless tobacco: Never Used  Substance and Sexual Activity  . Alcohol use: No    Alcohol/week: 0.0 standard drinks  . Drug use: No  . Sexual activity: Never    Birth control/protection: Abstinence  Lifestyle  . Physical activity:    Days per week: Not on file    Minutes per session: Not on file  . Stress: Not on file  Relationships  . Social connections:    Talks on phone: Not on file    Gets together: Not on file    Attends religious service: Not on file    Active member of club or organization: Not on file    Attends meetings of clubs or organizations: Not on file    Relationship status: Not on file  . Intimate partner violence:    Fear of current or ex partner: Not on file    Emotionally abused: Not on file    Physically abused: Not on file    Forced sexual activity: Not on file  Other Topics Concern    . Not on file  Social History Narrative  . Not on file      Review of Systems  All other systems reviewed and are negative.      Objective:   Physical Exam  Constitutional: He appears well-developed and well-nourished. No distress.  HENT:  Head: Normocephalic and atraumatic.  Eyes: Conjunctivae are normal. No scleral icterus.  Neck: Neck supple.  No JVD present. No thyromegaly present.  Cardiovascular: Normal rate, regular rhythm and normal heart sounds. Exam reveals no gallop and no friction rub.  No murmur heard. Pulmonary/Chest: Effort normal and breath sounds normal. No respiratory distress. He has no wheezes. He has no rales. He exhibits no tenderness.  Abdominal: Soft. Bowel sounds are normal. He exhibits no distension and no mass. There is no tenderness. There is no rebound and no guarding.  Musculoskeletal: He exhibits no edema.  Lymphadenopathy:    He has no cervical adenopathy.  Skin: He is not diaphoretic.  Vitals reviewed.         Assessment & Plan:  Essential hypertension  Polycythemia  Smoking  Diabetes mellitus type 2 in nonobese (HCC)  Pure hypercholesterolemia Spent more than 25 minutes today with the patient outlining his medical problems.  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  He needs to quit smoking as well as have a sleep study to rule out sleep apnea.  The sleep study has been scheduled.  He will try Wellbutrin XL 150 mg p.o. every morning in addition to nicotine patches to try to quit smoking.  His blood pressure is marginal today.  Patient would like to work on low-sodium low carbohydrate diet.  We discussed a low carbohydrate diet for more than 15 minutes.  I recommend less than 15 g of carbs per snack and less than 45 g of carbs per meal.  We discussed with carbohydrates were in the foods to avoid.  I also recommended exercise and weight loss.  He will take metformin 1000 mg p.o. twice daily and will recheck  a hemoglobin A1c in 3 months.  At that time I will schedule a diabetic eye exam after he is completed his sleep study.  I will check a urine microalbumin at that time as well.  Will also use Lipitor to try to drive his LDL cholesterol less than 100.  Reassess in 3 months.  Strongly encourage smoking cessation

## 2017-12-17 ENCOUNTER — Encounter: Payer: Self-pay | Admitting: Neurology

## 2017-12-17 ENCOUNTER — Ambulatory Visit (INDEPENDENT_AMBULATORY_CARE_PROVIDER_SITE_OTHER): Payer: BLUE CROSS/BLUE SHIELD | Admitting: Neurology

## 2017-12-17 VITALS — BP 127/77 | HR 72 | Ht 69.0 in | Wt 210.0 lb

## 2017-12-17 DIAGNOSIS — R7309 Other abnormal glucose: Secondary | ICD-10-CM

## 2017-12-17 DIAGNOSIS — R0683 Snoring: Secondary | ICD-10-CM | POA: Diagnosis not present

## 2017-12-17 DIAGNOSIS — D751 Secondary polycythemia: Secondary | ICD-10-CM

## 2017-12-17 DIAGNOSIS — E669 Obesity, unspecified: Secondary | ICD-10-CM | POA: Diagnosis not present

## 2017-12-17 DIAGNOSIS — F17200 Nicotine dependence, unspecified, uncomplicated: Secondary | ICD-10-CM

## 2017-12-17 NOTE — Progress Notes (Signed)
Subjective:    Patient ID: Collin Farmer is a 57 y.o. male.  HPI     Star Age, MD, PhD [facility] [address] [place],  [address]  Dear Dr. Dennard Schaumann,   I saw your patient, Collin Farmer, upon your kind request in my neurologic clinic today for initial consultation of his sleep disorder, in particular, concern for underlying obstructive sleep apnea. The patient is unaccompanied today as you know, Collin Farmer is a 57 year old right-handed gentleman with an underlying medical history of hypertension, smoking, polycythemia, hyperlipidemia, diabetes and obesity, who reports snoring. I reviewed your office note from 11/23/2017. His Epworth Sleepiness Score is 2/24, fatigue score is 10 out of 63. He is single/divorced and lives with his son and son's GF. He has 3 children, 4 GC. He smokes less than 1 pack per day, does not utilize alcohol and drinks caffeine in the form of unsweet tea, 2 glasses per day on average.  His A1c was elevated at 9.0 a couple months ago. He has reduced his carbs and has switched over to unsweet tea. He is trying to quit smoking, has been on Wellbutrin for the past few weeks. He is trying to lose weight. He tries to keep stable sleep habits, typically in bed by 10 or 10:30 and rise time is between 5:15 and 5:30. He has a one-hour commute typically. He has not fallen asleep at the wheel are dosed off at the stoplight. He is not aware of any family history of OSA. He works for [employer], rebuilds transmissions. He does not typically take any naps but may dose off while watching TV on the weekend. He does not have any night to night nocturia and denies morning headaches.  His Past Medical History Is Significant For: Past Medical History:  Diagnosis Date  . Diabetes mellitus type 2 in nonobese (HCC)   . HLD (hyperlipidemia)   . Hypertension   . Polycythemia   . Smoking     His Past Surgical History Is  Significant For: No past surgical history on file.  His Family History Is Significant For: No family history on file.  His Social History Is Significant For: Social History   Socioeconomic History  . Marital status: Single    Spouse name: Not on file  . Number of children: Not on file  . Years of education: Not on file  . Highest education level: Not on file  Occupational History  . Not on file  Social Needs  . Financial resource strain: Not on file  . Food insecurity:    Worry: Not on file    Inability: Not on file  . Transportation needs:    Medical: Not on file    Non-medical: Not on file  Tobacco Use  . Smoking status: Current Every Day Smoker    Packs/day: 0.50    Years: 37.00    Pack years: 18.50    Types: Cigarettes  . Smokeless tobacco: Never Used  Substance and Sexual Activity  . Alcohol use: No    Alcohol/week: 0.0 standard drinks  . Drug use: No  . Sexual activity: Never    Birth control/protection: Abstinence  Lifestyle  . Physical activity:    Days per week: Not on file    Minutes per session: Not on file  . Stress: Not on file  Relationships  . Social connections:    Talks on phone: Not on file    Gets together: Not on  file    Attends religious service: Not on file    Active member of club or organization: Not on file    Attends meetings of clubs or organizations: Not on file    Relationship status: Not on file  Other Topics Concern  . Not on file  Social History Narrative  . Not on file    His Allergies Are:  Allergies  Allergen Reactions  . Bee Venom Anaphylaxis  . Other     Bee stings  :   His Current Medications Are:  Outpatient Encounter Medications as of 12/17/2017  Medication Sig  . aspirin 81 MG tablet Take 81 mg by mouth daily.  Marland Kitchen atorvastatin (LIPITOR) 20 MG tablet Take 1 tablet (20 mg total) by mouth daily.  Marland Kitchen buPROPion (WELLBUTRIN XL) 150 MG 24 hr tablet Take 1 tablet (150 mg total) by mouth daily.  Marland Kitchen  lisinopril-hydrochlorothiazide (PRINZIDE,ZESTORETIC) 20-12.5 MG tablet Take 1 tablet by mouth daily.  . metFORMIN (GLUCOPHAGE) 500 MG tablet Take 1 tablet (500 mg total) by mouth 2 (two) times daily with a meal. In 2 weeks, Increase to 1000mg  by mouth 2(two) times daily (Patient taking differently: Take 1,000 mg by mouth 2 (two) times daily with a meal. In 2 weeks, Increase to 1000mg  by mouth 2(two) times daily)   No facility-administered encounter medications on file as of 12/17/2017.   :  Review of Systems:  Out of a complete 14 point review of systems, all are reviewed and negative with the exception of these symptoms as listed below: Review of Systems  Neurological:       Pt presents today to discuss his sleep. Pt has never had a sleep study but does endorse snoring.  Epworth Sleepiness Scale 0= would never doze 1= slight chance of dozing 2= moderate chance of dozing 3= high chance of dozing  Sitting and reading: 0 Watching TV: 1 Sitting inactive in a public place (ex. Theater or meeting): 0 As a passenger in a car for an hour without a break: 0 Lying down to rest in the afternoon: 1 Sitting and talking to someone: 0 Sitting quietly after lunch (no alcohol): 0 In a car, while stopped in traffic: 0 Total: 2     Objective:  Neurological Exam  Physical Exam Physical Examination:   Vitals:   12/17/17 0856  BP: 127/77  Pulse: 72    General Examination: The patient is a very pleasant 57 y.o. male in no acute distress. He appears well-developed and well-nourished and well groomed.   HEENT: Normocephalic, atraumatic, pupils are equal, round and reactive to light and accommodation. Extraocular tracking is good without limitation to gaze excursion or nystagmus noted. Normal smooth pursuit is noted. Hearing is grossly intact. Face is symmetric with normal facial animation and normal facial sensation. Speech is clear with no dysarthria noted. There is no hypophonia. There is no  lip, neck/head, jaw or voice tremor. Neck is supple with full range of passive and active motion. There are no carotid bruits on auscultation. Oropharynx exam reveals: mild mouth dryness, adequate dental hygiene and mild airway crowding, due to larger appearing uvula, smaller airway entry. Tonsils are small, about 1+ (?absent). Mallampati is class II. Tongue protrudes centrally and palate elevates symmetrically. Neck circumference is 17-1/4 inches. He has a minimal overbite.  Chest: Clear to auscultation without wheezing, rhonchi or crackles noted.  Heart: S1+S2+0, regular and normal without murmurs, rubs or gallops noted.   Abdomen: Soft, non-tender and non-distended with normal bowel sounds  appreciated on auscultation.  Extremities: There is no pitting edema in the distal lower extremities bilaterally.   Skin: Warm and dry without trophic changes noted.  Musculoskeletal: exam reveals no obvious joint deformities, tenderness or joint swelling or erythema.   Neurologically:  Mental status: The patient is awake, alert and oriented in all 4 spheres. His immediate and remote memory, attention, language skills and fund of knowledge are appropriate. There is no evidence of aphasia, agnosia, apraxia or anomia. Speech is clear with normal prosody and enunciation. Thought process is linear. Mood is normal and affect is normal.  Cranial nerves II - XII are as described above under HEENT exam. In addition: shoulder shrug is normal with equal shoulder height noted. Motor exam: Normal bulk, strength and tone is noted. There is no drift, tremor or rebound. Romberg is negative. Reflexes are 1+ throughout. Fine motor skills and coordination: intact with normal finger taps, normal hand movements, normal rapid alternating patting, normal foot taps and normal foot agility.  Cerebellar testing: No dysmetria or intention tremor on finger to nose testing. Heel to shin is unremarkable bilaterally. There is no truncal or  gait ataxia.  Sensory exam: grossly intact.  Gait, station and balance: He stands easily. No veering to one side is noted. No leaning to one side is noted. Posture is age-appropriate and stance is narrow based. Gait shows normal stride length and normal pace. No problems turning are noted. Tandem walk is unremarkable.   Assessment and Plan:  In summary, Collin Farmer is a very pleasant 57 y.o.-year old male with an underlying medical history of hypertension, smoking, polycythemia, hyperlipidemia, diabetes and obesity, whose history and physical exam are concerning for obstructive sleep apnea (OSA). I had a long chat with the patient about my findings and the diagnosis of OSA, its prognosis and treatment options. We talked about medical treatments, surgical interventions and non-pharmacological approaches. I explained in particular the risks and ramifications of untreated moderate to severe OSA, especially with respect to developing cardiovascular disease down the Road, including congestive heart failure, difficult to treat hypertension, cardiac arrhythmias, or stroke. Even type 2 diabetes has, in part, been linked to untreated OSA. Symptoms of untreated OSA include daytime sleepiness, memory problems, mood irritability and mood disorder such as depression and anxiety, lack of energy, as well as recurrent headaches, especially morning headaches. We talked about smoking cessation and trying to maintain a healthy lifestyle in general, as well as the importance of weight control. I encouraged the patient to eat healthy, exercise daily and keep well hydrated, to keep a scheduled bedtime and wake time routine, to not skip any meals and eat healthy snacks in between meals. I advised the patient not to drive when feeling sleepy. I recommended the following at this time: sleep study with potential positive airway pressure titration. (We will score hypopneas at 3%).   I explained the sleep test procedure to the  patient and also outlined possible surgical and non-surgical treatment options of OSA, including the use of a custom-made dental device (which would require a referral to a specialist dentist or oral surgeon), upper airway surgical options, such as pillar implants, radiofrequency surgery, tongue base surgery, and UPPP (which would involve a referral to an ENT surgeon). Rarely, jaw surgery such as mandibular advancement may be considered.  I also explained the CPAP treatment option to the patient, who indicated that he would be willing to try CPAP if the need arises. I explained the importance of being compliant with PAP  treatment, not only for insurance purposes but primarily to improve His symptoms, and for the patient's long term health benefit, including to reduce His cardiovascular risks. I answered all his questions today and the patient was in agreement. I plan to see him back after the sleep study is completed and encouraged him to call with any interim questions, concerns, problems or updates.   Thank you very much for allowing me to participate in the care of this nice patient. If I can be of any further assistance to you please do not hesitate to call me at [phone].  Sincerely,   Star Age, MD, PhD

## 2017-12-17 NOTE — Patient Instructions (Signed)

## 2018-01-11 ENCOUNTER — Ambulatory Visit (INDEPENDENT_AMBULATORY_CARE_PROVIDER_SITE_OTHER): Payer: BLUE CROSS/BLUE SHIELD | Admitting: Neurology

## 2018-01-11 DIAGNOSIS — F17200 Nicotine dependence, unspecified, uncomplicated: Secondary | ICD-10-CM

## 2018-01-11 DIAGNOSIS — E669 Obesity, unspecified: Secondary | ICD-10-CM

## 2018-01-11 DIAGNOSIS — G4761 Periodic limb movement disorder: Secondary | ICD-10-CM

## 2018-01-11 DIAGNOSIS — R9431 Abnormal electrocardiogram [ECG] [EKG]: Secondary | ICD-10-CM

## 2018-01-11 DIAGNOSIS — R7309 Other abnormal glucose: Secondary | ICD-10-CM

## 2018-01-11 DIAGNOSIS — G4720 Circadian rhythm sleep disorder, unspecified type: Secondary | ICD-10-CM

## 2018-01-11 DIAGNOSIS — R0683 Snoring: Secondary | ICD-10-CM

## 2018-01-11 DIAGNOSIS — D751 Secondary polycythemia: Secondary | ICD-10-CM

## 2018-01-14 NOTE — Procedures (Signed)
PATIENT'S NAME:  Collin Farmer, Collin Farmer DOB:      [birth year]      MR#:    [id number]     DATE OF RECORDING: 01/11/2018 REFERRING M.D.:  Jenna Luo, MD Study Performed:   Baseline Polysomnogram HISTORY: 57 year old man with a history of hypertension, smoking, polycythemia, hyperlipidemia, diabetes and obesity, who reports snoring. The patient endorsed the Epworth Sleepiness Scale at 2 points. The patient's weight 210 pounds with a height of 69 (inches), resulting in a BMI of 31. kg/m2. The patient's neck circumference measured 17.2 inches.  CURRENT MEDICATIONS: Aspirin, Lipitor, Wellbutrin, Prinzide, Glucophage   PROCEDURE:  This is a multichannel digital polysomnogram utilizing the Somnostar 11.2 system.  Electrodes and sensors were applied and monitored per AASM Specifications.   EEG, EOG, Chin and Limb EMG, were sampled at 200 Hz.  ECG, Snore and Nasal Pressure, Thermal Airflow, Respiratory Effort, CPAP Flow and Pressure, Oximetry was sampled at 50 Hz. Digital video and audio were recorded.      BASELINE STUDY  Lights Out was at 22:01 and Lights On at 05:00.  Total recording time (TRT) was 419 minutes, with a total sleep time (TST) of 364.5 minutes.   The patient's sleep latency was 28.5 minutes.  REM latency was 203 minutes, which is delayed. The sleep efficiency was 87. %.     SLEEP ARCHITECTURE: WASO (Wake after sleep onset) was 29 minutes with mild sleep fragmentation noted. There were 17 minutes in Stage N1, 259.5 minutes Stage N2, 38 minutes Stage N3 and 50 minutes in Stage REM.  The percentage of Stage N1 was 4.7%, Stage N2 was 71.2%, which is increased, Stage N3 was 10.4% and Stage R (REM sleep) was 13.7%, which is reduced. The arousals were noted as: 29 were spontaneous, 7 were associated with PLMs, 9 were associated with respiratory events.  RESPIRATORY ANALYSIS:  There were a total of 19 respiratory events:  7 obstructive apneas, 1 central apneas and 0 mixed apneas with a total of 8 apneas  and an apnea index (AI) of 1.3 /hour. There were 11 hypopneas with a hypopnea index of 1.8 /hour. The patient also had 0 respiratory event related arousals (RERAs).      The total APNEA/HYPOPNEA INDEX (AHI) was 3.1 /hour and the total RESPIRATORY DISTURBANCE INDEX was 0. 3.1 /hour.  1 events occurred in REM sleep and 22 events in NREM. The REM AHI was 1.2 /hour, versus a non-REM AHI of 3.4. The patient spent only 11 minutes of total sleep time in the supine position and 354 minutes in non-supine.. The supine AHI was 49.1/h versus a non-supine AHI of 1.7.  OXYGEN SATURATION & C02:  The Wake baseline 02 saturation was 92%, with the lowest being 87%. Time spent below 89% saturation equaled 9 minutes.   PERIODIC LIMB MOVEMENTS: The patient had a total of 276 Periodic Limb Movements.  The Periodic Limb Movement (PLM) index was 45.4 and the PLM Arousal index was 1.2/hour.  Audio and video analysis did not show any abnormal or unusual movements, behaviors, phonations or vocalizations. The patient took no bathroom breaks. Mild intermittent snoring was noted. The EKG was in keeping with normal sinus rhythm (NSR) with rare PVCs noted.  Post-study, the patient indicated that sleep was the same as usual.   IMPRESSION:  1. Periodic Limb Movement Disorder (PLMD) 2. Primary Snoring 3. Dysfunctions associated with sleep stages or arousal from sleep 4. Non-specific abnormal EKG  RECOMMENDATIONS:  1. This study does not demonstrate any significant obstructive  or central sleep disordered breathing with the exception of snoring and supine sleep apnea. For this, treatment with positive airway pressure is not warranted. Weight loss and avoidance of the supine sleep position will likely suffice as treatment.  2. This study shows sleep fragmentation and abnormal sleep stage percentages; these are nonspecific findings and per se do not signify an intrinsic sleep disorder or a cause for the patient's sleep-related  symptoms. Causes include (but are not limited to) the first night effect of the sleep study, circadian rhythm disturbances, medication effect or an underlying mood disorder or medical problem.  3. Moderate PLMs (periodic limb movements of sleep) were noted during this study with no significant arousals; clinical correlation is recommended.  4. The study showed rare PVCs on single lead EKG; clinical correlation is recommended and consultation with cardiology may be feasible.  5. The patient should be cautioned not to drive, work at heights, or operate dangerous or heavy equipment when tired or sleepy. Review and reiteration of good sleep hygiene measures should be pursued with any patient. 6. The patient will be advised to follow up with the referring provider, who will be notified of the test results. A follow up in sleep clinic can be arranged if needed.   I certify that I have reviewed the entire raw data recording prior to the issuance of this report in accordance with the Standards of Accreditation of the American Academy of Sleep Medicine (AASM)     Star Age, MD, PhD Diplomat, American Board of Neurology and Sleep Medicine (Neurology and Sleep Medicine)

## 2018-01-14 NOTE — Progress Notes (Signed)
Patient referred by Dr. Dennard Schaumann, seen by me on 12/17/17, diagnostic PSG on 01/11/18.   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 For this, treatment with positive airway pressure is not warranted. Weight loss and avoidance of the supine sleep position will likely suffice as treatment. 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 He has DM with elevated A1c.  EKG during the study showed rare PVCs, which generally speaking does not require cardiology eval. He can discuss seeing cardiology with PCP next visit, sooner, if he has Hx of palpitations or chest pain.   I can see him as needed (if he has RLS), otherwise he can FU with PCP.   Thanks,  Star Age, MD, PhD [facility])

## 2018-01-15 ENCOUNTER — Telehealth: Payer: Self-pay

## 2018-01-15 NOTE — Telephone Encounter (Addendum)
Pt called me back and I discussed his sleep study results with him. Pt is not bothered by RLS symptoms at home. Pt advised him of the rare PVCs but pt denies palpitations and chest pain but will speak with Dr. Dennard Schaumann at the next visit with him. Pt declined a f/u appt with Dr. Rexene Alberts. Pt verbalized understanding of results. Pt had no questions at this time but was encouraged to call back if questions arise.

## 2018-01-15 NOTE — Telephone Encounter (Signed)
-----   Message from Star Age, MD sent at 01/14/2018  6:16 PM EDT ----- Patient referred by Dr. Dennard Schaumann, seen by me on 12/17/17, diagnostic PSG on 01/11/18.   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 For this, treatment with positive airway pressure is not warranted. Weight loss and avoidance of the supine sleep position will likely suffice as treatment. 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 He has DM with elevated A1c.  EKG during the study showed rare PVCs, which generally speaking does not require cardiology eval. He can discuss seeing cardiology with PCP next visit, sooner, if he has Hx of palpitations or chest pain.   I can see him as needed (if he has RLS), otherwise he can FU with PCP.   Thanks,  Star Age, MD, PhD [facility])

## 2018-01-15 NOTE — Telephone Encounter (Signed)
I called pt to discuss his sleep study results. No answer, left a message asking him to call me back. 

## 2018-01-15 NOTE — Telephone Encounter (Signed)
Pt returning RNs call will be available at 3 during his break

## 2018-03-08 ENCOUNTER — Other Ambulatory Visit: Payer: BLUE CROSS/BLUE SHIELD

## 2018-03-08 DIAGNOSIS — E7800 Pure hypercholesterolemia, unspecified: Secondary | ICD-10-CM | POA: Diagnosis not present

## 2018-03-08 DIAGNOSIS — R739 Hyperglycemia, unspecified: Secondary | ICD-10-CM

## 2018-03-08 DIAGNOSIS — I10 Essential (primary) hypertension: Secondary | ICD-10-CM

## 2018-03-08 DIAGNOSIS — E119 Type 2 diabetes mellitus without complications: Secondary | ICD-10-CM

## 2018-03-08 DIAGNOSIS — Z79899 Other long term (current) drug therapy: Secondary | ICD-10-CM

## 2018-03-09 LAB — CBC WITH DIFFERENTIAL/PLATELET
BASOS ABS: 58 {cells}/uL (ref 0–200)
Basophils Relative: 0.5 %
Eosinophils Absolute: 197 cells/uL (ref 15–500)
Eosinophils Relative: 1.7 %
HEMATOCRIT: 52.5 % — AB (ref 38.5–50.0)
HEMOGLOBIN: 17.8 g/dL — AB (ref 13.2–17.1)
LYMPHS ABS: 2854 {cells}/uL (ref 850–3900)
MCH: 30.4 pg (ref 27.0–33.0)
MCHC: 33.9 g/dL (ref 32.0–36.0)
MCV: 89.6 fL (ref 80.0–100.0)
MPV: 10.8 fL (ref 7.5–12.5)
Monocytes Relative: 9.3 %
NEUTROS ABS: 7412 {cells}/uL (ref 1500–7800)
NEUTROS PCT: 63.9 %
Platelets: 227 10*3/uL (ref 140–400)
RBC: 5.86 10*6/uL — ABNORMAL HIGH (ref 4.20–5.80)
RDW: 12.4 % (ref 11.0–15.0)
Total Lymphocyte: 24.6 %
WBC: 11.6 10*3/uL — ABNORMAL HIGH (ref 3.8–10.8)
WBCMIX: 1079 {cells}/uL — AB (ref 200–950)

## 2018-03-09 LAB — LIPID PANEL
CHOL/HDL RATIO: 2.8 (calc) (ref <5.0)
Cholesterol: 120 mg/dL (ref <200)
HDL: 43 mg/dL (ref >40)
LDL CHOLESTEROL (CALC): 61 mg/dL
Non-HDL Cholesterol (Calc): 77 mg/dL (calc) (ref <130)
Triglycerides: 81 mg/dL (ref <150)

## 2018-03-09 LAB — COMPREHENSIVE METABOLIC PANEL
AG Ratio: 1.9 (calc) (ref 1.0–2.5)
ALBUMIN MSPROF: 4.1 g/dL (ref 3.6–5.1)
ALT: 26 U/L (ref 9–46)
AST: 18 U/L (ref 10–35)
Alkaline phosphatase (APISO): 78 U/L (ref 40–115)
BUN: 15 mg/dL (ref 7–25)
CHLORIDE: 104 mmol/L (ref 98–110)
CO2: 25 mmol/L (ref 20–32)
CREATININE: 0.87 mg/dL (ref 0.70–1.33)
Calcium: 9.1 mg/dL (ref 8.6–10.3)
GLOBULIN: 2.2 g/dL (ref 1.9–3.7)
GLUCOSE: 214 mg/dL — AB (ref 65–99)
POTASSIUM: 4.3 mmol/L (ref 3.5–5.3)
SODIUM: 140 mmol/L (ref 135–146)
TOTAL PROTEIN: 6.3 g/dL (ref 6.1–8.1)
Total Bilirubin: 0.5 mg/dL (ref 0.2–1.2)

## 2018-03-09 LAB — HEMOGLOBIN A1C
EAG (MMOL/L): 10.8 (calc)
Hgb A1c MFr Bld: 8.4 % of total Hgb — ABNORMAL HIGH (ref <5.7)
MEAN PLASMA GLUCOSE: 194 (calc)

## 2018-03-14 ENCOUNTER — Other Ambulatory Visit: Payer: Self-pay | Admitting: Family Medicine

## 2018-03-15 ENCOUNTER — Ambulatory Visit: Payer: BLUE CROSS/BLUE SHIELD | Admitting: Family Medicine

## 2018-03-29 ENCOUNTER — Ambulatory Visit: Payer: BLUE CROSS/BLUE SHIELD | Admitting: Family Medicine

## 2018-04-02 ENCOUNTER — Encounter: Payer: Self-pay | Admitting: Family Medicine

## 2018-04-12 ENCOUNTER — Encounter: Payer: Self-pay | Admitting: Family Medicine

## 2018-04-12 ENCOUNTER — Ambulatory Visit: Payer: BLUE CROSS/BLUE SHIELD | Admitting: Family Medicine

## 2018-04-12 VITALS — BP 112/80 | HR 74 | Temp 98.1°F | Resp 16 | Ht 69.0 in | Wt 210.0 lb

## 2018-04-12 DIAGNOSIS — F17200 Nicotine dependence, unspecified, uncomplicated: Secondary | ICD-10-CM

## 2018-04-12 DIAGNOSIS — I10 Essential (primary) hypertension: Secondary | ICD-10-CM

## 2018-04-12 DIAGNOSIS — E7800 Pure hypercholesterolemia, unspecified: Secondary | ICD-10-CM

## 2018-04-12 DIAGNOSIS — D751 Secondary polycythemia: Secondary | ICD-10-CM

## 2018-04-12 DIAGNOSIS — E119 Type 2 diabetes mellitus without complications: Secondary | ICD-10-CM | POA: Diagnosis not present

## 2018-04-12 MED ORDER — DAPAGLIFLOZIN PROPANEDIOL 10 MG PO TABS: 10.0000 mg | ORAL_TABLET | Freq: Every day | ORAL | 11 refills | Status: DC

## 2018-04-12 NOTE — Progress Notes (Signed)
Subjective:    Patient ID: Collin Farmer, male    DOB: July 01, 1960, 58 y.o.   MRN: [id number]  Medication Refill   06/19/17 Patient coming in for refill on his blood pressure medication. He is currently taking Zestoretic 20/12.5 one by mouth daily.  Blood pressure today is marginally elevated at 144/80.  He denies any chest pain shortness of breath or dyspnea on exertion.  He continues to smoke.  He admits that he is not minimally committed to quitting at the present time.  He did not like the way Chantix made him feel the last time he tried it and he had no success on Wellbutrin.  Colonoscopy is up-to-date and is not due again until the patient turns 58.  He is due for prostate cancer screening.  He is also due for Pneumovax 23 because of his smoking history.  At that time, my plan was: Colonoscopy is up-to-date.  Continue to encourage smoking cessation.  Blood pressure today is borderline elevated.  Patient has a way of checking his blood pressure at home and would like to record his blood pressures at home over the next few weeks and let me know the values.  If greater than 970 systolic, I would increase his blood pressure medication.  Patient received Pneumovax 23 today.  He declined HIV and hepatitis C screening.  Screen for prostate cancer with a PSA  10/15/17 His labs at that time revealed a hemoglobin of 18.4 as well as a random sugar of 122.  I recommended smoking cessation and recheck a CBC in 3 months and also check hemoglobin A1c.  Patient is here today for follow-up.  At his last visit, the patient was not fasting.  Therefore he believes the sugar was a random sugar and not indicative of his glycemic control which is reasonable.  Unfortunately he continues to smoke.  He is tried and failed Wellbutrin in the past as well as Chantix.  He had better success with nicotine patches.  At the present time he is pre-contemplative.  He denies any witnessed apneic episodes or hypersomnolence during  the daytime.  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  Recheck again today.  If even higher, I would recommend a sleep study to rule out obstructive sleep apnea, and I would also check lab work to rule out Jak 2 mutation.  Continue to encourage smoking cessation.  We discussed possibly trying a combination of Wellbutrin with nicotine patches.  He will consider that.  His blood pressure is great today at 128/80.  His elevated blood sugar was likely due to eating at his last visit.  He is fasting today.  We will check a hemoglobin A1c to verify if in fact the patient is prediabetic  11/23/17 On the patient's lab work at his last visit, his hemoglobin was greater than 19.  Therefore referred him to oncology for evaluation for polycythemia.  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  He has a sleep study scheduled next month.  He is interested in smoking cessation.  He does not want to try Chantix again because he had a bad reaction to this in the past.  He is interested in trying Wellbutrin and nicotine patches.  His lab work was also significant for a blood sugar greater than 200.  Hemoglobin A1c was found to  be 9 confirming type 2 diabetes mellitus.  His LDL cholesterol was also elevated at greater than 100.  He is here today to discuss treatment options.  At that time, my plan was: Spent more than 25 minutes today with the patient outlining his medical problems.  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  He needs to quit smoking as well as have a sleep study to rule out sleep apnea.  The sleep study has been scheduled.  He will try Wellbutrin XL 150 mg p.o. every morning in addition to nicotine patches to try to quit smoking.  His blood pressure is marginal today.  Patient would like to work on low-sodium low carbohydrate  diet.  We discussed a low carbohydrate diet for more than 15 minutes.  I recommend less than 15 g of carbs per snack and less than 45 g of carbs per meal.  We discussed with carbohydrates were in the foods to avoid.  I also recommended exercise and weight loss.  He will take metformin 1000 mg p.o. twice daily and will recheck a hemoglobin A1c in 3 months.  At that time I will schedule a diabetic eye exam after he is completed his sleep study.  I will check a urine microalbumin at that time as well.  Will also use Lipitor to try to drive his LDL cholesterol less than 100.  Reassess in 3 months.  Strongly encourage smoking cessation  04/12/18 Patient has only been taking metformin 1000 mg once a day.  He is forgetting to take his nighttime dose.  He is still smoking and wants to discontinue Wellbutrin as he is seen no benefit with smoking cessation.  His blood pressure is well controlled at 112/80.  His cholesterol is excellent on Lipitor.  His most recent lab work is listed below: No visits with results within 1 Month(s) from this visit.  Latest known visit with results is:  Lab on 03/08/2018  Component Date Value Ref Range Status  . WBC 03/08/2018 11.6* 3.8 - 10.8 Thousand/uL Final  . RBC 03/08/2018 5.86* 4.20 - 5.80 Million/uL Final  . Hemoglobin 03/08/2018 17.8* 13.2 - 17.1 g/dL Final  . HCT 03/08/2018 52.5* 38.5 - 50.0 % Final  . MCV 03/08/2018 89.6  80.0 - 100.0 fL Final  . MCH 03/08/2018 30.4  27.0 - 33.0 pg Final  . MCHC 03/08/2018 33.9  32.0 - 36.0 g/dL Final  . RDW 03/08/2018 12.4  11.0 - 15.0 % Final  . Platelets 03/08/2018 227  140 - 400 Thousand/uL Final  . MPV 03/08/2018 10.8  7.5 - 12.5 fL Final  . Neutro Abs 03/08/2018 7,412  1,500 - 7,800 cells/uL Final  . Lymphs Abs 03/08/2018 2,854  850 - 3,900 cells/uL Final  . WBC mixed population 03/08/2018 1,079* 200 - 950 cells/uL Final  . Eosinophils Absolute 03/08/2018 197  15 - 500 cells/uL Final  . Basophils Absolute 03/08/2018 58  0 -  200 cells/uL Final  . Neutrophils Relative % 03/08/2018 63.9  % Final  . Total Lymphocyte 03/08/2018 24.6  % Final  . Monocytes Relative 03/08/2018 9.3  % Final  . Eosinophils Relative 03/08/2018 1.7  % Final  . Basophils Relative 03/08/2018 0.5  % Final  . Glucose, Bld 03/08/2018 214* 65 - 99 mg/dL Final   Comment: .            Fasting reference interval . For someone without known diabetes, a glucose value >125 mg/dL indicates that they may  have diabetes and this should be confirmed with a follow-up test. .   . BUN 03/08/2018 15  7 - 25 mg/dL Final  . Creat 03/08/2018 0.87  0.70 - 1.33 mg/dL Final   Comment: For patients >1 years of age, the reference limit for Creatinine is approximately 13% higher for people identified as African-American. .   Havery Moros Ratio 24/58/0998 NOT APPLICABLE  6 - 22 (calc) Final  . Sodium 03/08/2018 140  135 - 146 mmol/L Final  . Potassium 03/08/2018 4.3  3.5 - 5.3 mmol/L Final  . Chloride 03/08/2018 104  98 - 110 mmol/L Final  . CO2 03/08/2018 25  20 - 32 mmol/L Final  . Calcium 03/08/2018 9.1  8.6 - 10.3 mg/dL Final  . Total Protein 03/08/2018 6.3  6.1 - 8.1 g/dL Final  . Albumin 03/08/2018 4.1  3.6 - 5.1 g/dL Final  . Globulin 03/08/2018 2.2  1.9 - 3.7 g/dL (calc) Final  . AG Ratio 03/08/2018 1.9  1.0 - 2.5 (calc) Final  . Total Bilirubin 03/08/2018 0.5  0.2 - 1.2 mg/dL Final  . Alkaline phosphatase (APISO) 03/08/2018 78  40 - 115 U/L Final  . AST 03/08/2018 18  10 - 35 U/L Final  . ALT 03/08/2018 26  9 - 46 U/L Final  . Cholesterol 03/08/2018 120  <200 mg/dL Final  . HDL 03/08/2018 43  >40 mg/dL Final  . Triglycerides 03/08/2018 81  <150 mg/dL Final  . LDL Cholesterol (Calc) 03/08/2018 61  mg/dL (calc) Final   Comment: Reference range: <100 . Desirable range <100 mg/dL for primary prevention;   <70 mg/dL for patients with CHD or diabetic patients  with > or = 2 CHD risk factors. Marland Kitchen LDL-C is now calculated using the  Martin-Hopkins  calculation, which is a validated novel method providing  better accuracy than the Friedewald equation in the  estimation of LDL-C.  Cresenciano Genre et al. Annamaria Helling. 3382;505(39): 2061-2068  (http://education.[facility].com/faq/FAQ164)   . Total CHOL/HDL Ratio 03/08/2018 2.8  <5.0 (calc) Final  . Non-HDL Cholesterol (Calc) 03/08/2018 77  <130 mg/dL (calc) Final   Comment: For patients with diabetes plus 1 major ASCVD risk  factor, treating to a non-HDL-C goal of <100 mg/dL  (LDL-C of <70 mg/dL) is considered a therapeutic  option.   . Hgb A1c MFr Bld 03/08/2018 8.4* <5.7 % of total Hgb Final   Comment: For someone without known diabetes, a hemoglobin A1c value of 6.5% or greater indicates that they may have  diabetes and this should be confirmed with a follow-up  test. . For someone with known diabetes, a value <7% indicates  that their diabetes is well controlled and a value  greater than or equal to 7% indicates suboptimal  control. A1c targets should be individualized based on  duration of diabetes, age, comorbid conditions, and  other considerations. . Currently, no consensus exists regarding use of hemoglobin A1c for diagnosis of diabetes for children. .   . Mean Plasma Glucose 03/08/2018 194  (calc) Final  . eAG (mmol/L) 03/08/2018 10.8  (calc) Final    Past Medical History:  Diagnosis Date  . Diabetes mellitus type 2 in nonobese (HCC)   . HLD (hyperlipidemia)   . Hypertension   . Polycythemia   . Smoking    Current Outpatient Medications on File Prior to Visit  Medication Sig Dispense Refill  . aspirin 81 MG tablet Take 81 mg by mouth daily.    Marland Kitchen atorvastatin (LIPITOR) 20 MG tablet Take 1  tablet (20 mg total) by mouth daily. 90 tablet 3  . lisinopril-hydrochlorothiazide (PRINZIDE,ZESTORETIC) 20-12.5 MG tablet Take 1 tablet by mouth daily. 30 tablet 11  . metFORMIN (GLUCOPHAGE) 500 MG tablet Take 1 tablet (500 mg total) by mouth 2 (two) times daily  with a meal. In 2 weeks, Increase to 1000mg  by mouth 2(two) times daily (Patient taking differently: Take 1,000 mg by mouth 2 (two) times daily with a meal. In 2 weeks, Increase to 1000mg  by mouth 2(two) times daily) 150 tablet 3   No current facility-administered medications on file prior to visit.    Allergies  Allergen Reactions  . Bee Venom Anaphylaxis  . Other     Bee stings   Social History   Socioeconomic History  . Marital status: Single    Spouse name: Not on file  . Number of children: Not on file  . Years of education: Not on file  . Highest education level: Not on file  Occupational History  . Not on file  Social Needs  . Financial resource strain: Not on file  . Food insecurity:    Worry: Not on file    Inability: Not on file  . Transportation needs:    Medical: Not on file    Non-medical: Not on file  Tobacco Use  . Smoking status: Current Every Day Smoker    Packs/day: 0.50    Years: 37.00    Pack years: 18.50    Types: Cigarettes  . Smokeless tobacco: Never Used  Substance and Sexual Activity  . Alcohol use: No    Alcohol/week: 0.0 standard drinks  . Drug use: No  . Sexual activity: Never    Birth control/protection: Abstinence  Lifestyle  . Physical activity:    Days per week: Not on file    Minutes per session: Not on file  . Stress: Not on file  Relationships  . Social connections:    Talks on phone: Not on file    Gets together: Not on file    Attends religious service: Not on file    Active member of club or organization: Not on file    Attends meetings of clubs or organizations: Not on file    Relationship status: Not on file  . Intimate partner violence:    Fear of current or ex partner: Not on file    Emotionally abused: Not on file    Physically abused: Not on file    Forced sexual activity: Not on file  Other Topics Concern  . Not on file  Social History Narrative  . Not on file      Review of Systems  All other systems  reviewed and are negative.      Objective:   Physical Exam  Constitutional: He appears well-developed and well-nourished. No distress.  HENT:  Head: Normocephalic and atraumatic.  Eyes: Conjunctivae are normal. No scleral icterus.  Neck: Neck supple. No JVD present. No thyromegaly present.  Cardiovascular: Normal rate, regular rhythm and normal heart sounds. Exam reveals no gallop and no friction rub.  No murmur heard. Pulmonary/Chest: Effort normal and breath sounds normal. No respiratory distress. He has no wheezes. He has no rales. He exhibits no tenderness.  Abdominal: Soft. Bowel sounds are normal. He exhibits no distension and no mass. There is no abdominal tenderness. There is no rebound and no guarding.  Musculoskeletal:        General: No edema.  Lymphadenopathy:    He has no cervical adenopathy.  Skin: He is not diaphoretic.  Vitals reviewed.         Assessment & Plan:  Essential hypertension  Polycythemia  Smoking  Diabetes mellitus type 2 in nonobese (HCC)  Pure hypercholesterolemia  Blood pressures well controlled.  Cholesterol is well controlled.  Hemoglobin A1c is 8.4.  Goal is 6.5.  Encourage the patient to take metformin twice daily and be consistent.  Add farxiga 10 mg a day and recheck lab work in 3 months.  Strongly encourage smoking cessation.  Discontinue Wellbutrin per patient's request.  Follow-up in 3 months

## 2018-05-13 ENCOUNTER — Other Ambulatory Visit: Payer: Self-pay | Admitting: Family Medicine

## 2018-05-13 ENCOUNTER — Telehealth: Payer: Self-pay | Admitting: Family Medicine

## 2018-05-13 DIAGNOSIS — I10 Essential (primary) hypertension: Secondary | ICD-10-CM

## 2018-05-13 NOTE — Telephone Encounter (Signed)
Patient has questions about his metformin Please call him back at [phone]

## 2018-05-14 NOTE — Telephone Encounter (Signed)
LMTRC

## 2018-05-16 NOTE — Telephone Encounter (Signed)
LMTRC

## 2018-05-17 ENCOUNTER — Other Ambulatory Visit (HOSPITAL_COMMUNITY): Payer: BLUE CROSS/BLUE SHIELD

## 2018-05-22 ENCOUNTER — Ambulatory Visit (HOSPITAL_COMMUNITY): Payer: BLUE CROSS/BLUE SHIELD | Admitting: Hematology

## 2018-05-24 ENCOUNTER — Ambulatory Visit (HOSPITAL_COMMUNITY): Payer: BLUE CROSS/BLUE SHIELD | Admitting: Hematology

## 2018-05-28 NOTE — Telephone Encounter (Signed)
After multiple phone calls back and forth pt has not described what he needed will close note.

## 2018-05-31 ENCOUNTER — Other Ambulatory Visit (HOSPITAL_COMMUNITY): Payer: BLUE CROSS/BLUE SHIELD

## 2018-06-07 ENCOUNTER — Ambulatory Visit (HOSPITAL_COMMUNITY): Payer: BLUE CROSS/BLUE SHIELD | Admitting: Hematology

## 2018-06-14 ENCOUNTER — Other Ambulatory Visit (HOSPITAL_COMMUNITY): Payer: BLUE CROSS/BLUE SHIELD

## 2018-06-17 ENCOUNTER — Telehealth: Payer: Self-pay | Admitting: Family Medicine

## 2018-06-17 DIAGNOSIS — I10 Essential (primary) hypertension: Secondary | ICD-10-CM

## 2018-06-17 MED ORDER — LISINOPRIL-HYDROCHLOROTHIAZIDE 20-12.5 MG PO TABS: 1.0000 | ORAL_TABLET | Freq: Every day | ORAL | 3 refills | Status: DC

## 2018-06-17 MED ORDER — METFORMIN HCL 500 MG PO TABS: 1000.0000 mg | ORAL_TABLET | Freq: Two times a day (BID) | ORAL | 1 refills | Status: DC

## 2018-06-17 MED ORDER — DAPAGLIFLOZIN PROPANEDIOL 10 MG PO TABS: 10.0000 mg | ORAL_TABLET | Freq: Every day | ORAL | 1 refills | Status: DC

## 2018-06-17 MED ORDER — ATORVASTATIN CALCIUM 20 MG PO TABS: 20.0000 mg | ORAL_TABLET | Freq: Every day | ORAL | 3 refills | Status: DC

## 2018-06-17 NOTE — Telephone Encounter (Signed)
Medication called/sent to requested pharmacy  

## 2018-06-17 NOTE — Telephone Encounter (Signed)
[facility] [address] - needs [place] to resend all prescriptions over as a 90 day supply, pt cannot afford just the regular refill price without 90 day supply.

## 2018-06-21 ENCOUNTER — Ambulatory Visit (HOSPITAL_COMMUNITY): Payer: BLUE CROSS/BLUE SHIELD | Admitting: Nurse Practitioner

## 2018-06-21 ENCOUNTER — Other Ambulatory Visit (HOSPITAL_COMMUNITY): Payer: Self-pay | Admitting: Nurse Practitioner

## 2018-06-21 DIAGNOSIS — D751 Secondary polycythemia: Secondary | ICD-10-CM

## 2018-07-15 ENCOUNTER — Telehealth: Payer: Self-pay | Admitting: Family Medicine

## 2018-08-11 ENCOUNTER — Other Ambulatory Visit: Payer: Self-pay | Admitting: Family Medicine

## 2018-10-09 ENCOUNTER — Other Ambulatory Visit: Payer: Self-pay | Admitting: Family Medicine

## 2018-10-09 MED ORDER — BUPROPION HCL ER (XL) 150 MG PO TB24: ORAL_TABLET | ORAL | 2 refills | Status: DC

## 2018-12-06 ENCOUNTER — Other Ambulatory Visit: Payer: Self-pay | Admitting: Family Medicine

## 2019-01-01 ENCOUNTER — Other Ambulatory Visit: Payer: Self-pay | Admitting: Family Medicine

## 2019-01-31 ENCOUNTER — Other Ambulatory Visit: Payer: Self-pay | Admitting: Family Medicine

## 2019-05-02 ENCOUNTER — Other Ambulatory Visit: Payer: Self-pay | Admitting: Family Medicine

## 2019-05-02 MED ORDER — BUPROPION HCL ER (XL) 150 MG PO TB24: ORAL_TABLET | ORAL | 0 refills | Status: DC

## 2019-05-02 MED ORDER — METFORMIN HCL 500 MG PO TABS: ORAL_TABLET | ORAL | 0 refills | Status: DC

## 2019-05-30 ENCOUNTER — Other Ambulatory Visit: Payer: Self-pay | Admitting: Family Medicine

## 2019-05-30 MED ORDER — METFORMIN HCL 500 MG PO TABS: ORAL_TABLET | ORAL | 0 refills | Status: DC

## 2019-06-06 ENCOUNTER — Other Ambulatory Visit: Payer: Self-pay | Admitting: Family Medicine

## 2019-06-06 MED ORDER — ATORVASTATIN CALCIUM 20 MG PO TABS: 20.0000 mg | ORAL_TABLET | Freq: Every day | ORAL | 0 refills | Status: DC

## 2019-06-28 ENCOUNTER — Other Ambulatory Visit: Payer: Self-pay | Admitting: Family Medicine

## 2019-07-06 ENCOUNTER — Other Ambulatory Visit: Payer: Self-pay | Admitting: Family Medicine

## 2019-07-28 ENCOUNTER — Other Ambulatory Visit: Payer: Self-pay | Admitting: Family Medicine

## 2019-08-13 ENCOUNTER — Other Ambulatory Visit: Payer: Self-pay | Admitting: Family Medicine

## 2019-08-13 MED ORDER — ATORVASTATIN CALCIUM 20 MG PO TABS: ORAL_TABLET | ORAL | 0 refills | Status: DC

## 2019-08-19 ENCOUNTER — Ambulatory Visit (INDEPENDENT_AMBULATORY_CARE_PROVIDER_SITE_OTHER): Payer: BC Managed Care – PPO | Admitting: Family Medicine

## 2019-08-19 ENCOUNTER — Encounter: Payer: Self-pay | Admitting: Family Medicine

## 2019-08-19 ENCOUNTER — Other Ambulatory Visit: Payer: Self-pay

## 2019-08-19 VITALS — BP 130/80 | HR 76 | Temp 97.7°F | Resp 16 | Ht 69.0 in | Wt 193.0 lb

## 2019-08-19 DIAGNOSIS — Z125 Encounter for screening for malignant neoplasm of prostate: Secondary | ICD-10-CM

## 2019-08-19 DIAGNOSIS — Z122 Encounter for screening for malignant neoplasm of respiratory organs: Secondary | ICD-10-CM

## 2019-08-19 DIAGNOSIS — E119 Type 2 diabetes mellitus without complications: Secondary | ICD-10-CM | POA: Diagnosis not present

## 2019-08-19 DIAGNOSIS — E7800 Pure hypercholesterolemia, unspecified: Secondary | ICD-10-CM

## 2019-08-19 DIAGNOSIS — F17200 Nicotine dependence, unspecified, uncomplicated: Secondary | ICD-10-CM | POA: Diagnosis not present

## 2019-08-19 DIAGNOSIS — I10 Essential (primary) hypertension: Secondary | ICD-10-CM

## 2019-08-19 NOTE — Progress Notes (Signed)
Subjective:    Patient ID: Collin Farmer, male    DOB: [birth year], 59 y.o.   MRN: [id number]  Medication Refill  06/19/17 Patient coming in for refill on his blood pressure medication. He is currently taking Zestoretic 20/12.5 one by mouth daily.  Blood pressure today is marginally elevated at 144/80.  He denies any chest pain shortness of breath or dyspnea on exertion.  He continues to smoke.  He admits that he is not minimally committed to quitting at the present time.  He did not like the way Chantix made him feel the last time he tried it and he had no success on Wellbutrin.  Colonoscopy is up-to-date and is not due again until the patient turns 58.  He is due for prostate cancer screening.  He is also due for Pneumovax 23 because of his smoking history.  At that time, my plan was: Colonoscopy is up-to-date.  Continue to encourage smoking cessation.  Blood pressure today is borderline elevated.  Patient has a way of checking his blood pressure at home and would like to record his blood pressures at home over the next few weeks and let me know the values.  If greater than [id number] systolic, I would increase his blood pressure medication.  Patient received Pneumovax 23 today.  He declined HIV and hepatitis C screening.  Screen for prostate cancer with a PSA  10/15/17 His labs at that time revealed a hemoglobin of 18.4 as well as a random sugar of 122.  I recommended smoking cessation and recheck a CBC in 3 months and also check hemoglobin A1c.  Patient is here today for follow-up.  At his last visit, the patient was not fasting.  Therefore he believes the sugar was a random sugar and not indicative of his glycemic control which is reasonable.  Unfortunately he continues to smoke.  He is tried and failed Wellbutrin in the past as well as Chantix.  He had better success with nicotine patches.  At the present time he is pre-contemplative.  He denies any witnessed apneic episodes or hypersomnolence during  the daytime.  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  Recheck again today.  If even higher, I would recommend a sleep study to rule out obstructive sleep apnea, and I would also check lab work to rule out Jak 2 mutation.  Continue to encourage smoking cessation.  We discussed possibly trying a combination of Wellbutrin with nicotine patches.  He will consider that.  His blood pressure is great today at 128/80.  His elevated blood sugar was likely due to eating at his last visit.  He is fasting today.  We will check a hemoglobin A1c to verify if in fact the patient is prediabetic  11/23/17 On the patient's lab work at his last visit, his hemoglobin was greater than 19.  Therefore referred him to oncology for evaluation for polycythemia.  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  He has a sleep study scheduled next month.  He is interested in smoking cessation.  He does not want to try Chantix again because he had a bad reaction to this in the past.  He is interested in trying Wellbutrin and nicotine patches.  His lab work was also significant for a blood sugar greater than 200.  Hemoglobin A1c was found to be  9 confirming type 2 diabetes mellitus.  His LDL cholesterol was also elevated at greater than 100.  He is here today to discuss treatment options.  At that time, my plan was: Spent more than 25 minutes today with the patient outlining his medical problems.  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  He needs to quit smoking as well as have a sleep study to rule out sleep apnea.  The sleep study has been scheduled.  He will try Wellbutrin XL 150 mg p.o. every morning in addition to nicotine patches to try to quit smoking.  His blood pressure is marginal today.  Patient would like to work on low-sodium low carbohydrate  diet.  We discussed a low carbohydrate diet for more than 15 minutes.  I recommend less than 15 g of carbs per snack and less than 45 g of carbs per meal.  We discussed with carbohydrates were in the foods to avoid.  I also recommended exercise and weight loss.  He will take metformin 1000 mg p.o. twice daily and will recheck a hemoglobin A1c in 3 months.  At that time I will schedule a diabetic eye exam after he is completed his sleep study.  I will check a urine microalbumin at that time as well.  Will also use Lipitor to try to drive his LDL cholesterol less than 100.  Reassess in 3 months.  Strongly encourage smoking cessation  04/12/18 Patient has only been taking metformin 1000 mg once a day.  He is forgetting to take his nighttime dose.  He is still smoking and wants to discontinue Wellbutrin as he is seen no benefit with smoking cessation.  His blood pressure is well controlled at 112/80.  His cholesterol is excellent on Lipitor.  His most recent lab work is listed below: No visits with results within 1 Month(s) from this visit.  Latest known visit with results is:  Lab on 03/08/2018  Component Date Value Ref Range Status  . WBC 03/08/2018 11.6* 3.8 - 10.8 Thousand/uL Final  . RBC 03/08/2018 5.86* 4.20 - 5.80 Million/uL Final  . Hemoglobin 03/08/2018 17.8* 13.2 - 17.1 g/dL Final  . HCT 03/08/2018 52.5* 38.5 - 50.0 % Final  . MCV 03/08/2018 89.6  80.0 - 100.0 fL Final  . MCH 03/08/2018 30.4  27.0 - 33.0 pg Final  . MCHC 03/08/2018 33.9  32.0 - 36.0 g/dL Final  . RDW 03/08/2018 12.4  11.0 - 15.0 % Final  . Platelets 03/08/2018 227  140 - 400 Thousand/uL Final  . MPV 03/08/2018 10.8  7.5 - 12.5 fL Final  . Neutro Abs 03/08/2018 7,412  1,500 - 7,800 cells/uL Final  . Lymphs Abs 03/08/2018 2,854  850 - 3,900 cells/uL Final  . WBC mixed population 03/08/2018 1,079* 200 - 950 cells/uL Final  . Eosinophils Absolute 03/08/2018 197  15 - 500 cells/uL Final  . Basophils Absolute 03/08/2018 58  0 -  200 cells/uL Final  . Neutrophils Relative % 03/08/2018 63.9  % Final  . Total Lymphocyte 03/08/2018 24.6  % Final  . Monocytes Relative 03/08/2018 9.3  % Final  . Eosinophils Relative 03/08/2018 1.7  % Final  . Basophils Relative 03/08/2018 0.5  % Final  . Glucose, Bld 03/08/2018 214* 65 - 99 mg/dL Final   Comment: .            Fasting reference interval . For someone without known diabetes, a glucose value >125 mg/dL indicates that they may have  diabetes and this should be confirmed with a follow-up test. .   . BUN 03/08/2018 15  7 - 25 mg/dL Final  . Creat 03/08/2018 0.87  0.70 - 1.33 mg/dL Final   Comment: For patients >67 years of age, the reference limit for Creatinine is approximately 13% higher for people identified as African-American. .   Havery Moros Ratio [id number] NOT APPLICABLE  6 - 22 (calc) Final  . Sodium 03/08/2018 140  135 - 146 mmol/L Final  . Potassium 03/08/2018 4.3  3.5 - 5.3 mmol/L Final  . Chloride 03/08/2018 104  98 - 110 mmol/L Final  . CO2 03/08/2018 25  20 - 32 mmol/L Final  . Calcium 03/08/2018 9.1  8.6 - 10.3 mg/dL Final  . Total Protein 03/08/2018 6.3  6.1 - 8.1 g/dL Final  . Albumin 03/08/2018 4.1  3.6 - 5.1 g/dL Final  . Globulin 03/08/2018 2.2  1.9 - 3.7 g/dL (calc) Final  . AG Ratio 03/08/2018 1.9  1.0 - 2.5 (calc) Final  . Total Bilirubin 03/08/2018 0.5  0.2 - 1.2 mg/dL Final  . Alkaline phosphatase (APISO) 03/08/2018 78  40 - 115 U/L Final  . AST 03/08/2018 18  10 - 35 U/L Final  . ALT 03/08/2018 26  9 - 46 U/L Final  . Cholesterol 03/08/2018 120  <200 mg/dL Final  . HDL 03/08/2018 43  >40 mg/dL Final  . Triglycerides 03/08/2018 81  <150 mg/dL Final  . LDL Cholesterol (Calc) 03/08/2018 61  mg/dL (calc) Final   Comment: Reference range: <100 . Desirable range <100 mg/dL for primary prevention;   <70 mg/dL for patients with CHD or diabetic patients  with > or = 2 CHD risk factors. Marland Kitchen LDL-C is now calculated using the  Martin-Hopkins  calculation, which is a validated novel method providing  better accuracy than the Friedewald equation in the  estimation of LDL-C.  Cresenciano Genre et al. Annamaria Helling. [id number]): 2061-2068  (http://education.[facility].com/faq/FAQ164)   . Total CHOL/HDL Ratio 03/08/2018 2.8  <5.0 (calc) Final  . Non-HDL Cholesterol (Calc) 03/08/2018 77  <130 mg/dL (calc) Final   Comment: For patients with diabetes plus 1 major ASCVD risk  factor, treating to a non-HDL-C goal of <100 mg/dL  (LDL-C of <70 mg/dL) is considered a therapeutic  option.   . Hgb A1c MFr Bld 03/08/2018 8.4* <5.7 % of total Hgb Final   Comment: For someone without known diabetes, a hemoglobin A1c value of 6.5% or greater indicates that they may have  diabetes and this should be confirmed with a follow-up  test. . For someone with known diabetes, a value <7% indicates  that their diabetes is well controlled and a value  greater than or equal to 7% indicates suboptimal  control. A1c targets should be individualized based on  duration of diabetes, age, comorbid conditions, and  other considerations. . Currently, no consensus exists regarding use of hemoglobin A1c for diagnosis of diabetes for children. .   . Mean Plasma Glucose 03/08/2018 194  (calc) Final  . eAG (mmol/L) 03/08/2018 10.8  (calc) Final  At that time, my plan was: Blood pressures well controlled.  Cholesterol is well controlled.  Hemoglobin A1c is 8.4.  Goal is 6.5.  Encourage the patient to take metformin twice daily and be consistent.  Add farxiga 10 mg a day and recheck lab work in 3 months.  Strongly encourage smoking cessation.  Discontinue Wellbutrin per patient's request.  Follow-up in 3 months  08/19/19 Patient has not been seen since.  He returns today for checkup as we would not refill his medication without an office visit.  He is not checking his blood sugar.  Patient does not know how to check his blood sugar.  He denies any polyuria,  polydipsia, or blurry vision.  He denies any neuropathy in his feet.  He denies any chest pain shortness of breath or dyspnea on exertion.  Unfortunately he continues to smoke.  He is in the precontemplative phase and has no desire to quit at the present time.  He is smoking about 1 pack cigarettes a day.  He has done that since he was 59 years old.  Therefore he has greater than 30-pack-year history of smoking and I would qualify for lung cancer screening.  He is using Metformin and [place].  He is compliant with his Lipitor.  He denies any myalgias or right upper quadrant pain.  His blood pressure today is well controlled at 130/80.  He has not had his Covid vaccination.  I strongly recommended Covid vaccination.    Past Medical History:  Diagnosis Date  . Diabetes mellitus type 2 in nonobese (HCC)   . HLD (hyperlipidemia)   . Hypertension   . Polycythemia   . Smoking    Current Outpatient Medications on File Prior to Visit  Medication Sig Dispense Refill  . aspirin 81 MG tablet Take 81 mg by mouth daily.    Marland Kitchen atorvastatin (LIPITOR) 20 MG tablet TAKE 1 TABLET(20 MG) BY MOUTH DAILY (Needs office visit and labs before further refills) 90 tablet 0  . buPROPion (WELLBUTRIN XL) 150 MG 24 hr tablet TAKE 1 TABLET(150 MG) BY MOUTH DAILY 90 tablet 0  . FARXIGA 10 MG TABS tablet TAKE 1 TABLET BY MOUTH DAILY 90 tablet 1  . lisinopril-hydrochlorothiazide (PRINZIDE,ZESTORETIC) 20-12.5 MG tablet Take 1 tablet by mouth daily. 90 tablet 3  . metFORMIN (GLUCOPHAGE) 500 MG tablet TAKE 2 TABLETS(1000 MG) BY MOUTH TWICE DAILY WITH A MEAL 120 tablet 0   No current facility-administered medications on file prior to visit.   Allergies  Allergen Reactions  . Bee Venom Anaphylaxis  . Other     Bee stings   Social History   Socioeconomic History  . Marital status: Single    Spouse name: Not on file  . Number of children: Not on file  . Years of education: Not on file  . Highest education level: Not on file   Occupational History  . Not on file  Tobacco Use  . Smoking status: Current Every Day Smoker    Packs/day: 0.50    Years: 37.00    Pack years: 18.50    Types: Cigarettes  . Smokeless tobacco: Never Used  Substance and Sexual Activity  . Alcohol use: No    Alcohol/week: 0.0 standard drinks  . Drug use: No  . Sexual activity: Never    Birth control/protection: Abstinence  Other Topics Concern  . Not on file  Social History Narrative  . Not on file   Social Determinants of Health   Financial Resource Strain:   . Difficulty of Paying Living Expenses:   Food Insecurity:   . Worried About Charity fundraiser in the Last Year:   . Arboriculturist in the Last Year:   Transportation Needs:   . Film/video editor (Medical):   Marland Kitchen Lack of Transportation (Non-Medical):   Physical Activity:   . Days of Exercise per Week:   . Minutes of Exercise per Session:  Stress:   . Feeling of Stress :   Social Connections:   . Frequency of Communication with Friends and Family:   . Frequency of Social Gatherings with Friends and Family:   . Attends Religious Services:   . Active Member of Clubs or Organizations:   . Attends Archivist Meetings:   Marland Kitchen Marital Status:   Intimate Partner Violence:   . Fear of Current or Ex-Partner:   . Emotionally Abused:   Marland Kitchen Physically Abused:   . Sexually Abused:       Review of Systems  All other systems reviewed and are negative.      Objective:   Physical Exam  Constitutional: He appears well-developed and well-nourished. No distress.  HENT:  Head: Normocephalic and atraumatic.  Eyes: Conjunctivae are normal. No scleral icterus.  Neck: No JVD present. No thyromegaly present.  Cardiovascular: Normal rate, regular rhythm and normal heart sounds. Exam reveals no gallop and no friction rub.  No murmur heard. Pulmonary/Chest: Effort normal and breath sounds normal. No respiratory distress. He has no wheezes. He has no rales. He  exhibits no tenderness.  Abdominal: Soft. Bowel sounds are normal. He exhibits no distension and no mass. There is no abdominal tenderness. There is no rebound and no guarding.  Musculoskeletal:        General: No edema.     Cervical back: Neck supple.  Lymphadenopathy:    He has no cervical adenopathy.  Skin: He is not diaphoretic.  Vitals reviewed.         Assessment & Plan:  Encounter for screening for lung cancer - Plan: CT CHEST LUNG CA SCREEN LOW DOSE W/O CM  Diabetes mellitus type 2 in nonobese ([place]) - Plan: Hemoglobin A1c, Lipid panel, Microalbumin, urine, CBC with Differential/Platelet, COMPLETE METABOLIC PANEL WITH GFR  Prostate cancer screening - Plan: PSA  Smoking  Pure hypercholesterolemia  Essential hypertension  Patient is overdue for prostate cancer screening.  Therefore I will check a PSA with the patient is here.  I strongly recommended Covid vaccination.  Also provided patient contact information to receive the Covid vaccine.  I strongly recommended smoking cessation however the patient is in the precontemplative phase.  Regarding his blood pressure is well controlled at 130/80 however I did encourage the patient to check his blood pressure at home to ensure that it is well controlled consistently.  I will check a hemoglobin A1c.  Goal hemoglobin A1c is less than 6.5.  If greater than 6.5, I would recommend Trulicity and I would need the patient to start to check his blood sugars fasting every morning.  Therefore we will await the results of today determine the next step in therapy.  I will also screen for kidney damage with a urine microalbumin.  Check a fasting lipid panel.  Ideally I like his LDL cholesterol to be less than 100.

## 2019-08-20 LAB — MICROALBUMIN, URINE: Microalb, Ur: 1.5 mg/dL

## 2019-08-20 LAB — HEMOGLOBIN A1C
Hgb A1c MFr Bld: 6.9 % of total Hgb — ABNORMAL HIGH (ref <5.7)
Mean Plasma Glucose: 151 (calc)
eAG (mmol/L): 8.4 (calc)

## 2019-08-20 LAB — COMPLETE METABOLIC PANEL WITH GFR
AG Ratio: 1.7 (calc) (ref 1.0–2.5)
ALT: 25 U/L (ref 9–46)
AST: 20 U/L (ref 10–35)
Albumin: 4.6 g/dL (ref 3.6–5.1)
Alkaline phosphatase (APISO): 71 U/L (ref 35–144)
BUN: 18 mg/dL (ref 7–25)
CO2: 24 mmol/L (ref 20–32)
Calcium: 9.9 mg/dL (ref 8.6–10.3)
Chloride: 100 mmol/L (ref 98–110)
Creat: 0.87 mg/dL (ref 0.70–1.25)
GFR, Est African American: 109 mL/min/{1.73_m2} (ref >=60)
GFR, Est Non African American: 94 mL/min/{1.73_m2} (ref >=60)
Globulin: 2.7 g/dL (calc) (ref 1.9–3.7)
Glucose, Bld: 116 mg/dL — ABNORMAL HIGH (ref 65–99)
Potassium: 4.4 mmol/L (ref 3.5–5.3)
Sodium: 139 mmol/L (ref 135–146)
Total Bilirubin: 0.8 mg/dL (ref 0.2–1.2)
Total Protein: 7.3 g/dL (ref 6.1–8.1)

## 2019-08-20 LAB — LIPID PANEL
Cholesterol: 213 mg/dL — ABNORMAL HIGH (ref <200)
HDL: 48 mg/dL (ref >=40)
LDL Cholesterol (Calc): 134 mg/dL (calc) — ABNORMAL HIGH
Non-HDL Cholesterol (Calc): 165 mg/dL (calc) — ABNORMAL HIGH (ref <130)
Total CHOL/HDL Ratio: 4.4 (calc) (ref <5.0)
Triglycerides: 169 mg/dL — ABNORMAL HIGH (ref <150)

## 2019-08-20 LAB — CBC WITH DIFFERENTIAL/PLATELET
Absolute Monocytes: 1273 cells/uL — ABNORMAL HIGH (ref 200–950)
Basophils Absolute: 88 cells/uL (ref 0–200)
Basophils Relative: 0.7 %
Eosinophils Absolute: 126 cells/uL (ref 15–500)
Eosinophils Relative: 1 %
HCT: 57.1 % — ABNORMAL HIGH (ref 38.5–50.0)
Hemoglobin: 19.5 g/dL — ABNORMAL HIGH (ref 13.2–17.1)
Lymphs Abs: 3213 cells/uL (ref 850–3900)
MCH: 30.7 pg (ref 27.0–33.0)
MCHC: 34.2 g/dL (ref 32.0–36.0)
MCV: 89.9 fL (ref 80.0–100.0)
MPV: 10.5 fL (ref 7.5–12.5)
Monocytes Relative: 10.1 %
Neutro Abs: 7900 cells/uL — ABNORMAL HIGH (ref 1500–7800)
Neutrophils Relative %: 62.7 %
Platelets: 253 10*3/uL (ref 140–400)
RBC: 6.35 10*6/uL — ABNORMAL HIGH (ref 4.20–5.80)
RDW: 13.1 % (ref 11.0–15.0)
Total Lymphocyte: 25.5 %
WBC: 12.6 10*3/uL — ABNORMAL HIGH (ref 3.8–10.8)

## 2019-08-20 LAB — PSA: PSA: 1.3 ng/mL (ref <=4.0)

## 2019-08-22 ENCOUNTER — Other Ambulatory Visit: Payer: Self-pay

## 2019-08-25 ENCOUNTER — Other Ambulatory Visit: Payer: Self-pay | Admitting: Family Medicine

## 2019-08-26 ENCOUNTER — Ambulatory Visit (HOSPITAL_COMMUNITY): Payer: BC Managed Care – PPO

## 2019-09-22 ENCOUNTER — Other Ambulatory Visit: Payer: Self-pay

## 2019-09-22 ENCOUNTER — Telehealth: Payer: Self-pay | Admitting: Family Medicine

## 2019-09-22 DIAGNOSIS — I10 Essential (primary) hypertension: Secondary | ICD-10-CM

## 2019-09-22 MED ORDER — BUPROPION HCL ER (XL) 150 MG PO TB24: ORAL_TABLET | ORAL | 0 refills | Status: DC

## 2019-09-22 MED ORDER — LISINOPRIL-HYDROCHLOROTHIAZIDE 20-12.5 MG PO TABS: 1.0000 | ORAL_TABLET | Freq: Every day | ORAL | 3 refills | Status: DC

## 2019-09-22 NOTE — Telephone Encounter (Signed)
Patient left vm asking for his lisinopril and his bupropion called into [facility] on [address] He left this message on Saturday and states he is going to be out today.   CB#[phone]

## 2019-09-24 ENCOUNTER — Other Ambulatory Visit: Payer: Self-pay | Admitting: Family Medicine

## 2019-10-03 ENCOUNTER — Telehealth: Payer: Self-pay | Admitting: Family Medicine

## 2019-10-10 NOTE — Telephone Encounter (Signed)
CB#[phone] Pt need refill Atorvastatin also Dr.Pickard up medication dosage to 40 mg instead of 20 mg

## 2019-10-13 ENCOUNTER — Other Ambulatory Visit: Payer: Self-pay

## 2019-10-13 MED ORDER — ATORVASTATIN CALCIUM 20 MG PO TABS: ORAL_TABLET | ORAL | 1 refills | Status: DC

## 2019-10-13 NOTE — Telephone Encounter (Signed)
Pt refill sent in

## 2019-10-24 ENCOUNTER — Other Ambulatory Visit: Payer: Self-pay | Admitting: Family Medicine

## 2019-11-26 ENCOUNTER — Ambulatory Visit (HOSPITAL_COMMUNITY): Payer: BC Managed Care – PPO

## 2019-11-28 ENCOUNTER — Ambulatory Visit (HOSPITAL_COMMUNITY): Payer: BC Managed Care – PPO

## 2019-12-18 ENCOUNTER — Other Ambulatory Visit: Payer: Self-pay | Admitting: Family Medicine

## 2020-03-25 ENCOUNTER — Other Ambulatory Visit: Payer: Self-pay | Admitting: Family Medicine

## 2020-04-04 ENCOUNTER — Other Ambulatory Visit: Payer: Self-pay | Admitting: Family Medicine

## 2020-04-26 ENCOUNTER — Other Ambulatory Visit: Payer: Self-pay | Admitting: Family Medicine

## 2020-06-19 ENCOUNTER — Other Ambulatory Visit: Payer: Self-pay | Admitting: Family Medicine

## 2020-07-11 ENCOUNTER — Other Ambulatory Visit: Payer: Self-pay | Admitting: Family Medicine

## 2020-07-19 ENCOUNTER — Other Ambulatory Visit: Payer: Self-pay | Admitting: Family Medicine

## 2020-08-20 ENCOUNTER — Ambulatory Visit: Payer: BC Managed Care – PPO | Admitting: Family Medicine

## 2020-08-27 ENCOUNTER — Ambulatory Visit (INDEPENDENT_AMBULATORY_CARE_PROVIDER_SITE_OTHER): Payer: BC Managed Care – PPO | Admitting: Family Medicine

## 2020-08-27 ENCOUNTER — Encounter: Payer: Self-pay | Admitting: Family Medicine

## 2020-08-27 ENCOUNTER — Other Ambulatory Visit: Payer: Self-pay

## 2020-08-27 VITALS — BP 132/74 | HR 66 | Temp 98.2°F | Resp 16 | Ht 69.0 in | Wt 189.0 lb

## 2020-08-27 DIAGNOSIS — E7800 Pure hypercholesterolemia, unspecified: Secondary | ICD-10-CM | POA: Diagnosis not present

## 2020-08-27 DIAGNOSIS — D751 Secondary polycythemia: Secondary | ICD-10-CM | POA: Diagnosis not present

## 2020-08-27 DIAGNOSIS — F17200 Nicotine dependence, unspecified, uncomplicated: Secondary | ICD-10-CM | POA: Diagnosis not present

## 2020-08-27 DIAGNOSIS — E119 Type 2 diabetes mellitus without complications: Secondary | ICD-10-CM

## 2020-08-27 DIAGNOSIS — I10 Essential (primary) hypertension: Secondary | ICD-10-CM

## 2020-08-27 NOTE — Progress Notes (Signed)
Subjective:    Patient ID: Collin Farmer, male    DOB: May 28, 1960, 60 y.o.   MRN: [id number]  Medication Refill  Patient is a very pleasant 60 year old Caucasian male who is here today for a checkup.  Past medical history is significant for type 2 diabetes mellitus.  His last A1c was below 7.  However on his last lab work, his hemoglobin was 19.5.  He has been checked for sleep apnea and does not have sleep apnea.  However he does smoke.  Question is whether he has polycythemia vera versus secondary polycythemia due to smoking.  Unfortunately he continues to smoke.  He denies any chest pain shortness of breath or dyspnea on exertion.  He is due for lung cancer screening as he has a greater than 20-pack-year history of smoking.  He denies any hemoptysis.  He denies any polyuria polydipsia or blurry vision or peripheral neuropathy  Past Medical History:  Diagnosis Date  . Diabetes mellitus type 2 in nonobese (HCC)   . HLD (hyperlipidemia)   . Hypertension   . Polycythemia   . Smoking    Current Outpatient Medications on File Prior to Visit  Medication Sig Dispense Refill  . aspirin 81 MG tablet Take 81 mg by mouth daily.    Marland Kitchen atorvastatin (LIPITOR) 20 MG tablet TAKE 1 TABLET(20 MG) BY MOUTH DAILY 90 tablet 1  . FARXIGA 10 MG TABS tablet TAKE 1 TABLET BY MOUTH DAILY 90 tablet 0  . lisinopril-hydrochlorothiazide (ZESTORETIC) 20-12.5 MG tablet Take 1 tablet by mouth daily. 90 tablet 3  . metFORMIN (GLUCOPHAGE) 500 MG tablet TAKE 2 TABLETS(1000 MG) BY MOUTH TWICE DAILY WITH A MEAL 120 tablet 3   No current facility-administered medications on file prior to visit.   Allergies  Allergen Reactions  . Bee Venom Anaphylaxis  . Other     Bee stings   Social History   Socioeconomic History  . Marital status: Single    Spouse name: Not on file  . Number of children: Not on file  . Years of education: Not on file  . Highest education level: Not on file  Occupational History  . Not on  file  Tobacco Use  . Smoking status: Current Every Day Smoker    Packs/day: 0.50    Years: 37.00    Pack years: 18.50    Types: Cigarettes  . Smokeless tobacco: Never Used  Substance and Sexual Activity  . Alcohol use: No    Alcohol/week: 0.0 standard drinks  . Drug use: No  . Sexual activity: Never    Birth control/protection: Abstinence  Other Topics Concern  . Not on file  Social History Narrative  . Not on file   Social Determinants of Health   Financial Resource Strain: Not on file  Food Insecurity: Not on file  Transportation Needs: Not on file  Physical Activity: Not on file  Stress: Not on file  Social Connections: Not on file  Intimate Partner Violence: Not on file      Review of Systems  All other systems reviewed and are negative.      Objective:   Physical Exam Vitals reviewed.  Constitutional:      General: He is not in acute distress.    Appearance: He is well-developed. He is not diaphoretic.  HENT:     Head: Normocephalic and atraumatic.  Eyes:     General: No scleral icterus.    Conjunctiva/sclera: Conjunctivae normal.  Neck:  Thyroid: No thyromegaly.     Vascular: No JVD.  Cardiovascular:     Rate and Rhythm: Normal rate and regular rhythm.     Heart sounds: Normal heart sounds. No murmur heard. No friction rub. No gallop.   Pulmonary:     Effort: Pulmonary effort is normal. No respiratory distress.     Breath sounds: Normal breath sounds. No wheezing or rales.  Chest:     Chest wall: No tenderness.  Abdominal:     General: Bowel sounds are normal. There is no distension.     Palpations: Abdomen is soft. There is no mass.     Tenderness: There is no abdominal tenderness. There is no guarding or rebound.  Musculoskeletal:     Cervical back: Neck supple.  Lymphadenopathy:     Cervical: No cervical adenopathy.           Assessment & Plan:  Pure hypercholesterolemia - Plan: Hemoglobin A1c, CBC with Differential/Platelet,  COMPLETE METABOLIC PANEL WITH GFR, Lipid panel, Microalbumin, urine  Essential hypertension - Plan: Hemoglobin A1c, CBC with Differential/Platelet, COMPLETE METABOLIC PANEL WITH GFR, Lipid panel, Microalbumin, urine  Diabetes mellitus type 2 in nonobese (HCC) - Plan: Hemoglobin A1c, CBC with Differential/Platelet, COMPLETE METABOLIC PANEL WITH GFR, Lipid panel, Microalbumin, urine  Smoking - Plan: CT CHEST LUNG CA SCREEN LOW DOSE W/O CM  Polycythemia - Plan: Hemoglobin A1c, CBC with Differential/Platelet, COMPLETE METABOLIC PANEL WITH GFR, Lipid panel, Microalbumin, urine  Continue to encourage the patient to quit smoking.  Blood pressure today is well controlled at 132/74.  I will check an A1c.  Goal A1c is less than 7.  I will also check a urine microalbumin.  Check a fasting lipid panel.  Goal LDL cholesterol is less than 100.  Recheck a CBC.  If hemoglobin remains greater than 19, I will consult hematology to rule out polycythemia vera although I have a feeling the majority of this is secondary polycythemia secondary to smoking.  Therefore I continue to encourage the patient to quit smoking.  I will also screen the patient for lung cancer with a CT scan of the chest

## 2020-08-28 LAB — LIPID PANEL
Cholesterol: 131 mg/dL (ref <200)
HDL: 47 mg/dL (ref >=40)
LDL Cholesterol (Calc): 70 mg/dL (calc)
Non-HDL Cholesterol (Calc): 84 mg/dL (calc) (ref <130)
Total CHOL/HDL Ratio: 2.8 (calc) (ref <5.0)
Triglycerides: 63 mg/dL (ref <150)

## 2020-08-28 LAB — CBC WITH DIFFERENTIAL/PLATELET
Absolute Monocytes: 941 cells/uL (ref 200–950)
Basophils Absolute: 89 cells/uL (ref 0–200)
Basophils Relative: 0.9 %
Eosinophils Absolute: 149 cells/uL (ref 15–500)
Eosinophils Relative: 1.5 %
HCT: 56.9 % — ABNORMAL HIGH (ref 38.5–50.0)
Hemoglobin: 18.9 g/dL — ABNORMAL HIGH (ref 13.2–17.1)
Lymphs Abs: 2554 cells/uL (ref 850–3900)
MCH: 29.8 pg (ref 27.0–33.0)
MCHC: 33.2 g/dL (ref 32.0–36.0)
MCV: 89.7 fL (ref 80.0–100.0)
MPV: 10.6 fL (ref 7.5–12.5)
Monocytes Relative: 9.5 %
Neutro Abs: 6168 cells/uL (ref 1500–7800)
Neutrophils Relative %: 62.3 %
Platelets: 231 10*3/uL (ref 140–400)
RBC: 6.34 10*6/uL — ABNORMAL HIGH (ref 4.20–5.80)
RDW: 12.7 % (ref 11.0–15.0)
Total Lymphocyte: 25.8 %
WBC: 9.9 10*3/uL (ref 3.8–10.8)

## 2020-08-28 LAB — HEMOGLOBIN A1C
Hgb A1c MFr Bld: 7.6 % of total Hgb — ABNORMAL HIGH (ref <5.7)
Mean Plasma Glucose: 171 mg/dL
eAG (mmol/L): 9.5 mmol/L

## 2020-08-28 LAB — COMPLETE METABOLIC PANEL WITH GFR
AG Ratio: 1.8 (calc) (ref 1.0–2.5)
ALT: 25 U/L (ref 9–46)
AST: 20 U/L (ref 10–35)
Albumin: 4.6 g/dL (ref 3.6–5.1)
Alkaline phosphatase (APISO): 70 U/L (ref 35–144)
BUN: 13 mg/dL (ref 7–25)
CO2: 26 mmol/L (ref 20–32)
Calcium: 9.6 mg/dL (ref 8.6–10.3)
Chloride: 103 mmol/L (ref 98–110)
Creat: 0.79 mg/dL (ref 0.70–1.25)
GFR, Est African American: 112 mL/min/{1.73_m2} (ref >=60)
GFR, Est Non African American: 97 mL/min/{1.73_m2} (ref >=60)
Globulin: 2.5 g/dL (calc) (ref 1.9–3.7)
Glucose, Bld: 114 mg/dL — ABNORMAL HIGH (ref 65–99)
Potassium: 4.4 mmol/L (ref 3.5–5.3)
Sodium: 142 mmol/L (ref 135–146)
Total Bilirubin: 0.7 mg/dL (ref 0.2–1.2)
Total Protein: 7.1 g/dL (ref 6.1–8.1)

## 2020-08-28 LAB — MICROALBUMIN, URINE: Microalb, Ur: 1.3 mg/dL

## 2020-09-01 ENCOUNTER — Other Ambulatory Visit: Payer: Self-pay | Admitting: *Deleted

## 2020-09-01 MED ORDER — SITAGLIPTIN PHOSPHATE 100 MG PO TABS: 100.0000 mg | ORAL_TABLET | Freq: Every day | ORAL | 1 refills | Status: DC

## 2020-09-02 ENCOUNTER — Telehealth: Payer: Self-pay | Admitting: *Deleted

## 2020-09-02 NOTE — Telephone Encounter (Signed)
Received call from patient [phone]~ telephone.   Reports that Januvia is >$75. States that he will not be able to afford this prescription.   Please advise.

## 2020-09-03 MED ORDER — PIOGLITAZONE HCL 30 MG PO TABS: 30.0000 mg | ORAL_TABLET | Freq: Every day | ORAL | 3 refills | Status: DC

## 2020-09-03 NOTE — Telephone Encounter (Signed)
Prescription sent to pharmacy. .   Call placed to patient and patient made aware.  

## 2020-09-03 NOTE — Telephone Encounter (Signed)
Replace januvia with actos 30 mg a day.

## 2020-09-05 ENCOUNTER — Other Ambulatory Visit: Payer: Self-pay | Admitting: Family Medicine

## 2020-09-05 DIAGNOSIS — I10 Essential (primary) hypertension: Secondary | ICD-10-CM

## 2020-10-09 ENCOUNTER — Other Ambulatory Visit: Payer: Self-pay | Admitting: Family Medicine

## 2020-10-09 DIAGNOSIS — I10 Essential (primary) hypertension: Secondary | ICD-10-CM

## 2020-10-22 ENCOUNTER — Other Ambulatory Visit: Payer: Self-pay | Admitting: Family Medicine

## 2020-10-22 ENCOUNTER — Telehealth: Payer: Self-pay | Admitting: *Deleted

## 2020-10-22 MED ORDER — PREDNISONE 20 MG PO TABS
ORAL_TABLET | ORAL | 0 refills | Status: DC
Start: 2020-10-22 — End: 2020-12-03

## 2020-10-22 NOTE — Telephone Encounter (Signed)
Call placed to patient and patient made aware per VM.  

## 2020-10-22 NOTE — Telephone Encounter (Signed)
Received call from patient.   Reports that he was doing yard work and cutting down a tree in the past weeks. States that he was exposed to [facility] and now has rash on B arms.   Reports that he is using calamine lotion with minimal relief.   Requested prednisone taper. Please advise.

## 2020-12-03 ENCOUNTER — Ambulatory Visit: Payer: BC Managed Care – PPO | Admitting: Family Medicine

## 2020-12-03 ENCOUNTER — Other Ambulatory Visit: Payer: Self-pay

## 2020-12-03 ENCOUNTER — Encounter: Payer: Self-pay | Admitting: Family Medicine

## 2020-12-03 VITALS — BP 128/74 | HR 70 | Temp 98.3°F | Resp 14 | Ht 69.0 in | Wt 191.0 lb

## 2020-12-03 DIAGNOSIS — I10 Essential (primary) hypertension: Secondary | ICD-10-CM | POA: Diagnosis not present

## 2020-12-03 DIAGNOSIS — D751 Secondary polycythemia: Secondary | ICD-10-CM

## 2020-12-03 DIAGNOSIS — E7800 Pure hypercholesterolemia, unspecified: Secondary | ICD-10-CM | POA: Diagnosis not present

## 2020-12-03 DIAGNOSIS — F17200 Nicotine dependence, unspecified, uncomplicated: Secondary | ICD-10-CM

## 2020-12-03 DIAGNOSIS — E119 Type 2 diabetes mellitus without complications: Secondary | ICD-10-CM

## 2020-12-03 NOTE — Progress Notes (Signed)
Subjective:    Patient ID: Collin Farmer, male    DOB: Sep 25, 1960, 60 y.o.   MRN: [id number]  Patient is a very pleasant 60 year old Caucasian gentleman who presents today for a checkup.  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  He has been screened for obstructive sleep apnea and there has been no evidence of sleep apnea found.  His blood pressure today is outstanding at 128/74.  At his last office visit, his A1c was 62.6 and as result we added pioglitazone to [place] and metformin.  He denies any hypoglycemic episodes.  He denies any neuropathy in his feet.  He denies any claudication in his legs.  He denies any chest pain shortness of breath or dyspnea on exertion.  Physically he seems to be doing quite well.  Unfortunately he continues to smoke.  He has tried Wellbutrin, Chantix, nicotine patches.  He states that so far nothing has worked. Past Medical History:  Diagnosis Date   Diabetes mellitus type 2 in nonobese (HCC)    HLD (hyperlipidemia)    Hypertension    Polycythemia    Smoking    Current Outpatient Medications on File Prior to Visit  Medication Sig Dispense Refill   aspirin 81 MG tablet Take 81 mg by mouth daily.     atorvastatin (LIPITOR) 20 MG tablet TAKE 1 TABLET(20 MG) BY MOUTH DAILY 90 tablet 1   buPROPion (WELLBUTRIN XL) 150 MG 24 hr tablet TAKE 1 TABLET(150 MG) BY MOUTH DAILY 90 tablet 0   FARXIGA 10 MG TABS tablet TAKE 1 TABLET BY MOUTH DAILY 90 tablet 0   lisinopril-hydrochlorothiazide (ZESTORETIC) 20-12.5 MG tablet TAKE 1 TABLET BY MOUTH DAILY 90 tablet 0   metFORMIN (GLUCOPHAGE) 500 MG tablet TAKE 2 TABLETS(1000 MG) BY MOUTH TWICE DAILY WITH A MEAL 120 tablet 3   pioglitazone (ACTOS) 30 MG tablet Take 1 tablet (30 mg total) by mouth daily. 90 tablet 3   predniSONE (DELTASONE) 20 MG tablet 3 tabs poqday 1-2, 2 tabs poqday 3-4, 1 tab poqday 5-6 12 tablet 0   No current facility-administered medications on file prior to visit.    Allergies  Allergen Reactions   Bee Venom Anaphylaxis   Other     Bee stings   Social History   Socioeconomic History   Marital status: Single    Spouse name: Not on file   Number of children: Not on file   Years of education: Not on file   Highest education level: Not on file  Occupational History   Not on file  Tobacco Use   Smoking status: Every Day    Packs/day: 0.50    Years: 37.00    Pack years: 18.50    Types: Cigarettes   Smokeless tobacco: Never  Substance and Sexual Activity   Alcohol use: No    Alcohol/week: 0.0 standard drinks   Drug use: No   Sexual activity: Never    Birth control/protection: Abstinence  Other Topics Concern   Not on file  Social History Narrative   Not on file   Social Determinants of Health   Financial Resource Strain: Not on file  Food Insecurity: Not on file  Transportation Needs: Not on file  Physical Activity: Not on file  Stress: Not on file  Social Connections: Not on file  Intimate Partner Violence: Not on file      Review of Systems  All other systems reviewed and are negative.  Objective:   Physical Exam Vitals reviewed.  Constitutional:      General: He is not in acute distress.    Appearance: He is well-developed. He is not diaphoretic.  HENT:     Head: Normocephalic and atraumatic.  Eyes:     General: No scleral icterus.    Conjunctiva/sclera: Conjunctivae normal.  Neck:     Thyroid: No thyromegaly.     Vascular: No JVD.  Cardiovascular:     Rate and Rhythm: Normal rate and regular rhythm.     Heart sounds: Normal heart sounds. No murmur heard.   No friction rub. No gallop.  Pulmonary:     Effort: Pulmonary effort is normal. No respiratory distress.     Breath sounds: Normal breath sounds. No wheezing or rales.  Chest:     Chest wall: No tenderness.  Abdominal:     General: Bowel sounds are normal. There is no distension.     Palpations: Abdomen is soft. There is no mass.     Tenderness:  There is no abdominal tenderness. There is no guarding or rebound.  Musculoskeletal:     Cervical back: Neck supple.  Lymphadenopathy:     Cervical: No cervical adenopathy.          Assessment & Plan:  Diabetes mellitus type 2 in nonobese (HCC) - Plan: CBC with Differential/Platelet, COMPLETE METABOLIC PANEL WITH GFR, Lipid panel, Microalbumin, urine, Hemoglobin A1c  Essential hypertension  Pure hypercholesterolemia  Smoking  Polycythemia I am extremely happy with his blood pressure.  It looks excellent.  I suspect that his A1c will be below 7 with the addition of pioglitazone.  We will recheck an A1c today.  I would like to keep his LDL cholesterol below 100 so we will check a lipid panel today as well.  At that point will have controlled all of his risk factors as well as possible aside from the smoking.  Therefore I "fussed" at the patient extensively about his smoking and encouraged him to try by any means necessary to quit.  He is a really nice man and I hate to see anything bad happen to him.  However at the present time he is in the precontemplative phase

## 2020-12-04 LAB — CBC WITH DIFFERENTIAL/PLATELET
Absolute Monocytes: 970 cells/uL — ABNORMAL HIGH (ref 200–950)
Basophils Absolute: 71 cells/uL (ref 0–200)
Basophils Relative: 0.7 %
Eosinophils Absolute: 141 cells/uL (ref 15–500)
Eosinophils Relative: 1.4 %
HCT: 55 % — ABNORMAL HIGH (ref 38.5–50.0)
Hemoglobin: 18.6 g/dL — ABNORMAL HIGH (ref 13.2–17.1)
Lymphs Abs: 2828 cells/uL (ref 850–3900)
MCH: 30.8 pg (ref 27.0–33.0)
MCHC: 33.8 g/dL (ref 32.0–36.0)
MCV: 91.1 fL (ref 80.0–100.0)
MPV: 10.7 fL (ref 7.5–12.5)
Monocytes Relative: 9.6 %
Neutro Abs: 6090 cells/uL (ref 1500–7800)
Neutrophils Relative %: 60.3 %
Platelets: 223 10*3/uL (ref 140–400)
RBC: 6.04 10*6/uL — ABNORMAL HIGH (ref 4.20–5.80)
RDW: 13.2 % (ref 11.0–15.0)
Total Lymphocyte: 28 %
WBC: 10.1 10*3/uL (ref 3.8–10.8)

## 2020-12-04 LAB — COMPLETE METABOLIC PANEL WITH GFR
AG Ratio: 1.8 (calc) (ref 1.0–2.5)
ALT: 19 U/L (ref 9–46)
AST: 17 U/L (ref 10–35)
Albumin: 4.4 g/dL (ref 3.6–5.1)
Alkaline phosphatase (APISO): 67 U/L (ref 35–144)
BUN: 14 mg/dL (ref 7–25)
CO2: 28 mmol/L (ref 20–32)
Calcium: 9.9 mg/dL (ref 8.6–10.3)
Chloride: 105 mmol/L (ref 98–110)
Creat: 0.82 mg/dL (ref 0.70–1.35)
Globulin: 2.4 g/dL (calc) (ref 1.9–3.7)
Glucose, Bld: 106 mg/dL — ABNORMAL HIGH (ref 65–99)
Potassium: 4.6 mmol/L (ref 3.5–5.3)
Sodium: 142 mmol/L (ref 135–146)
Total Bilirubin: 0.6 mg/dL (ref 0.2–1.2)
Total Protein: 6.8 g/dL (ref 6.1–8.1)
eGFR: 99 mL/min/{1.73_m2} (ref >=60)

## 2020-12-04 LAB — MICROALBUMIN, URINE: Microalb, Ur: 0.9 mg/dL

## 2020-12-04 LAB — LIPID PANEL
Cholesterol: 134 mg/dL (ref <200)
HDL: 48 mg/dL (ref >=40)
LDL Cholesterol (Calc): 69 mg/dL (calc)
Non-HDL Cholesterol (Calc): 86 mg/dL (calc) (ref <130)
Total CHOL/HDL Ratio: 2.8 (calc) (ref <5.0)
Triglycerides: 90 mg/dL (ref <150)

## 2020-12-04 LAB — HEMOGLOBIN A1C
Hgb A1c MFr Bld: 7.6 % of total Hgb — ABNORMAL HIGH (ref <5.7)
Mean Plasma Glucose: 171 mg/dL
eAG (mmol/L): 9.5 mmol/L

## 2020-12-10 ENCOUNTER — Other Ambulatory Visit: Payer: Self-pay | Admitting: *Deleted

## 2020-12-10 MED ORDER — GLIPIZIDE ER 5 MG PO TB24: 5.0000 mg | ORAL_TABLET | Freq: Every day | ORAL | 1 refills | Status: DC

## 2020-12-26 ENCOUNTER — Other Ambulatory Visit: Payer: Self-pay | Admitting: Family Medicine

## 2021-01-10 ENCOUNTER — Other Ambulatory Visit: Payer: Self-pay | Admitting: *Deleted

## 2021-01-10 DIAGNOSIS — I10 Essential (primary) hypertension: Secondary | ICD-10-CM

## 2021-01-10 MED ORDER — LISINOPRIL-HYDROCHLOROTHIAZIDE 20-12.5 MG PO TABS: 1.0000 | ORAL_TABLET | Freq: Every day | ORAL | 0 refills | Status: DC

## 2021-01-19 ENCOUNTER — Other Ambulatory Visit: Payer: Self-pay | Admitting: Family Medicine

## 2021-02-05 ENCOUNTER — Other Ambulatory Visit: Payer: Self-pay | Admitting: Family Medicine

## 2021-02-07 ENCOUNTER — Other Ambulatory Visit: Payer: Self-pay

## 2021-03-11 ENCOUNTER — Ambulatory Visit: Payer: BC Managed Care – PPO | Admitting: Family Medicine

## 2021-03-22 ENCOUNTER — Other Ambulatory Visit: Payer: Self-pay

## 2021-03-22 MED ORDER — PIOGLITAZONE HCL 30 MG PO TABS: 30.0000 mg | ORAL_TABLET | Freq: Every day | ORAL | 3 refills | Status: DC

## 2021-04-09 ENCOUNTER — Other Ambulatory Visit: Payer: Self-pay | Admitting: Family Medicine

## 2021-04-13 ENCOUNTER — Other Ambulatory Visit: Payer: Self-pay | Admitting: Family Medicine

## 2021-05-06 ENCOUNTER — Other Ambulatory Visit: Payer: Self-pay

## 2021-05-06 ENCOUNTER — Encounter: Payer: Self-pay | Admitting: Family Medicine

## 2021-05-06 ENCOUNTER — Ambulatory Visit: Payer: BC Managed Care – PPO | Admitting: Family Medicine

## 2021-05-06 VITALS — BP 148/82 | HR 81 | Temp 97.0°F | Resp 18 | Ht 69.0 in | Wt 201.0 lb

## 2021-05-06 DIAGNOSIS — E119 Type 2 diabetes mellitus without complications: Secondary | ICD-10-CM | POA: Diagnosis not present

## 2021-05-06 DIAGNOSIS — M7551 Bursitis of right shoulder: Secondary | ICD-10-CM | POA: Diagnosis not present

## 2021-05-06 NOTE — Progress Notes (Signed)
Subjective:    Patient ID: Collin Farmer, male    DOB: 09-25-1960, 61 y.o.   MRN: [id number] Reports several weeks of pain in right shoulder.  Hurts to raise harm over head (>90 degrees).  Positive empty can and hawkins.  Denies falls or injuries.  No crepitus on exam.  No arm weakness.  No symptoms of cervical radiculopathy. Past Medical History:  Diagnosis Date   Diabetes mellitus type 2 in nonobese (HCC)    HLD (hyperlipidemia)    Hypertension    Polycythemia    Smoking    Current Outpatient Medications on File Prior to Visit  Medication Sig Dispense Refill   aspirin 81 MG tablet Take 81 mg by mouth daily.     atorvastatin (LIPITOR) 20 MG tablet TAKE 1 TABLET(20 MG) BY MOUTH DAILY 90 tablet 3   buPROPion (WELLBUTRIN XL) 150 MG 24 hr tablet TAKE 1 TABLET(150 MG) BY MOUTH DAILY 90 tablet 3   FARXIGA 10 MG TABS tablet TAKE 1 TABLET BY MOUTH DAILY 90 tablet 0   glipiZIDE (GLUCOTROL XL) 5 MG 24 hr tablet Take 1 tablet (5 mg total) by mouth daily with breakfast. 90 tablet 1   lisinopril-hydrochlorothiazide (ZESTORETIC) 20-12.5 MG tablet Take 1 tablet by mouth daily. 90 tablet 0   metFORMIN (GLUCOPHAGE) 500 MG tablet TAKE 2 TABLETS(1000 MG) BY MOUTH TWICE DAILY WITH A MEAL 120 tablet 3   metFORMIN (GLUCOPHAGE) 500 MG tablet TAKE 2 TABLETS(1000 MG) BY MOUTH TWICE DAILY WITH A MEAL 120 tablet 3   pioglitazone (ACTOS) 30 MG tablet Take 1 tablet (30 mg total) by mouth daily. 90 tablet 3   No current facility-administered medications on file prior to visit.   Allergies  Allergen Reactions   Bee Venom Anaphylaxis   Other     Bee stings   Social History   Socioeconomic History   Marital status: Single    Spouse name: Not on file   Number of children: Not on file   Years of education: Not on file   Highest education level: Not on file  Occupational History   Not on file  Tobacco Use   Smoking status: Every Day    Packs/day: 0.50    Years: 37.00    Pack years: 18.50    Types:  Cigarettes   Smokeless tobacco: Never  Substance and Sexual Activity   Alcohol use: No    Alcohol/week: 0.0 standard drinks   Drug use: No   Sexual activity: Never    Birth control/protection: Abstinence  Other Topics Concern   Not on file  Social History Narrative   Not on file   Social Determinants of Health   Financial Resource Strain: Not on file  Food Insecurity: Not on file  Transportation Needs: Not on file  Physical Activity: Not on file  Stress: Not on file  Social Connections: Not on file  Intimate Partner Violence: Not on file      Review of Systems  All other systems reviewed and are negative.     Objective:   Physical Exam Vitals reviewed.  Constitutional:      General: He is not in acute distress.    Appearance: He is well-developed. He is not diaphoretic.  HENT:     Head: Normocephalic and atraumatic.  Eyes:     General: No scleral icterus.    Conjunctiva/sclera: Conjunctivae normal.  Neck:     Thyroid: No thyromegaly.     Vascular: No JVD.  Cardiovascular:  Rate and Rhythm: Normal rate and regular rhythm.     Heart sounds: Normal heart sounds. No murmur heard.   No friction rub. No gallop.  Pulmonary:     Effort: Pulmonary effort is normal. No respiratory distress.     Breath sounds: Normal breath sounds. No wheezing or rales.  Chest:     Chest wall: No tenderness.  Abdominal:     General: Bowel sounds are normal. There is no distension.     Palpations: Abdomen is soft. There is no mass.     Tenderness: There is no abdominal tenderness. There is no guarding or rebound.  Musculoskeletal:     Right shoulder: No effusion, bony tenderness or crepitus. Decreased range of motion. Normal strength.     Cervical back: Neck supple.  Lymphadenopathy:     Cervical: No cervical adenopathy.          Assessment & Plan:    Diabetes mellitus type 2 in nonobese (HCC) - Plan: Hemoglobin A1c  Subacromial bursitis of right shoulder joint Believe  the patient has subacromial bursitis.  Using sterile technique, I injected the right subacromial space with 2 cc lidocaine, 2 cc of Marcaine, and 2 cc of 40 mg/mL Kenalog.  Recheck in 1 week if no better or sooner if worsening.  While the patient is here today I will repeat an A1c.  His A1c was 7.6 prior to starting glipizide.  Has been on the medication now for 4 months.

## 2021-05-07 LAB — HEMOGLOBIN A1C
Hgb A1c MFr Bld: 7.1 % of total Hgb — ABNORMAL HIGH (ref <5.7)
Mean Plasma Glucose: 157 mg/dL
eAG (mmol/L): 8.7 mmol/L

## 2021-05-12 ENCOUNTER — Telehealth: Payer: Self-pay | Admitting: Family Medicine

## 2021-05-12 NOTE — Telephone Encounter (Signed)
Patient received cortisone shot in right shoulder on 2/3; not feeling any better. Called to follow up and inform provider as instructed. Please advise at [phone].  Cell phone signal doesn't always work when patient in work building; ok to leave a message and patient will call back.

## 2021-05-13 ENCOUNTER — Other Ambulatory Visit: Payer: Self-pay | Admitting: Family Medicine

## 2021-05-13 DIAGNOSIS — M75101 Unspecified rotator cuff tear or rupture of right shoulder, not specified as traumatic: Secondary | ICD-10-CM

## 2021-05-13 NOTE — Telephone Encounter (Signed)
Pt advised of order and that someone from [facility] will call him to schedule.

## 2021-06-03 ENCOUNTER — Ambulatory Visit
Admission: RE | Admit: 2021-06-03 | Discharge: 2021-06-03 | Disposition: A | Discharge status: Home / Self Care | Payer: BC Managed Care – PPO | Source: Ambulatory Visit | Attending: Family Medicine | Admitting: Family Medicine

## 2021-06-03 ENCOUNTER — Other Ambulatory Visit: Payer: Self-pay

## 2021-06-03 DIAGNOSIS — M75101 Unspecified rotator cuff tear or rupture of right shoulder, not specified as traumatic: Secondary | ICD-10-CM

## 2021-06-03 DIAGNOSIS — M25511 Pain in right shoulder: Secondary | ICD-10-CM | POA: Diagnosis not present

## 2021-06-10 ENCOUNTER — Other Ambulatory Visit: Payer: Self-pay

## 2021-06-10 DIAGNOSIS — M75111 Incomplete rotator cuff tear or rupture of right shoulder, not specified as traumatic: Secondary | ICD-10-CM

## 2021-06-17 ENCOUNTER — Telehealth: Payer: Self-pay | Admitting: Orthopaedic Surgery

## 2021-06-17 ENCOUNTER — Other Ambulatory Visit: Payer: Self-pay

## 2021-06-17 ENCOUNTER — Ambulatory Visit: Payer: BC Managed Care – PPO | Admitting: Orthopaedic Surgery

## 2021-06-17 DIAGNOSIS — G8929 Other chronic pain: Secondary | ICD-10-CM | POA: Diagnosis not present

## 2021-06-17 DIAGNOSIS — M25511 Pain in right shoulder: Secondary | ICD-10-CM | POA: Diagnosis not present

## 2021-06-17 NOTE — Telephone Encounter (Signed)
Please change the referral to Occupational  for Shoulder pain  ? ?thanks ?

## 2021-06-17 NOTE — Progress Notes (Signed)
? ?Office Visit Note ?  ?Patient: Collin Farmer           ?Date of Birth: 09/09/1960           ?MRN: [id number] ?Visit Date: 06/17/2021 ?             ?Requested by: Susy Frizzle, MD ?[address] [place],  [place] [id number] ?PCP: Susy Frizzle, MD ? ? ?Assessment & Plan: ?Visit Diagnoses:  ?1. Chronic right shoulder pain   ? ? ?Plan: Based on findings I feel that he is more symptomatic from the adhesive capsulitis that was found on MRI.  He does have partial thickness tearing of the supraspinatus but does not seem to be overly symptomatic from this.  Prior subacromial injection was ineffective.  Based on options I have recommended a glenohumeral injection and physical therapy and relative rest from his work to focus more on the adhesive capsulitis.  Follow-up in about 6 weeks for recheck. ? ?Follow-Up Instructions: Return in about 6 weeks (around 07/29/2021).  ? ?Orders:  ?Orders Placed This Encounter  ?Procedures  ? Ambulatory referral to Physical Therapy  ? ?No orders of the defined types were placed in this encounter. ? ? ? ? Procedures: ?No procedures performed ? ? ?Clinical Data: ?No additional findings. ? ? ?Subjective: ?Chief Complaint  ?Patient presents with  ? Right Shoulder - Pain  ? ? ?HPI ? ?[name] is a very pleasant 61 year old gentleman referral from Dr. Dennard Schaumann for chronic right shoulder pain.  Recent MRI was done on 06/03/2021.  Patient underwent subacromial injection with temporary relief.  He is left-hand dominant.  Works at [employer] and does a lot of heavy lifting. ? ?Review of Systems  ?Constitutional: Negative.   ?All other systems reviewed and are negative. ? ? ?Objective: ?Vital Signs: There were no vitals taken for this visit. ? ?Physical Exam ?Vitals and nursing note reviewed.  ?Constitutional:   ?   Appearance: He is well-developed.  ?HENT:  ?   Head: Normocephalic and atraumatic.  ?Eyes:  ?   Pupils: Pupils are equal, round, and reactive to light.  ?Pulmonary:  ?   Effort:  Pulmonary effort is normal.  ?Abdominal:  ?   Palpations: Abdomen is soft.  ?Musculoskeletal:     ?   General: Normal range of motion.  ?   Cervical back: Neck supple.  ?Skin: ?   General: Skin is warm.  ?Neurological:  ?   Mental Status: He is alert and oriented to person, place, and time.  ?Psychiatric:     ?   Behavior: Behavior normal.     ?   Thought Content: Thought content normal.     ?   Judgment: Judgment normal.  ? ? ?Ortho Exam ? ?Examination of the right shoulder shows moderate pain with passive range of motion in all planes.  He has mild to moderate decrease flexion external rotation internal rotation and abduction compared to contralateral side.  Strength is normal with manual muscle testing of the supraspinatus infraspinatus and subscapularis.  Minimal pain with impingement.  AC joint is nontender. ? ?Specialty Comments:  ?No specialty comments available. ? ?Imaging: ?No results found. ? ? ?PMFS History: ?Patient Active Problem List  ? Diagnosis Date Noted  ? HLD (hyperlipidemia)   ? Polycythemia   ? Diabetes mellitus type 2 in nonobese [facility])   ? Smoking   ? ?Past Medical History:  ?Diagnosis Date  ? Diabetes mellitus type 2 in nonobese [facility])   ?  HLD (hyperlipidemia)   ? Hypertension   ? Polycythemia   ? Smoking   ?  ?No family history on file.  ?No past surgical history on file. ?Social History  ? ?Occupational History  ? Not on file  ?Tobacco Use  ? Smoking status: Every Day  ?  Packs/day: 0.50  ?  Years: 37.00  ?  Pack years: 18.50  ?  Types: Cigarettes  ? Smokeless tobacco: Never  ?Substance and Sexual Activity  ? Alcohol use: No  ?  Alcohol/week: 0.0 standard drinks  ? Drug use: No  ? Sexual activity: Never  ?  Birth control/protection: Abstinence  ? ? ? ? ? ? ?

## 2021-06-17 NOTE — Telephone Encounter (Signed)
Please change. Thank you. ? ?

## 2021-06-26 ENCOUNTER — Other Ambulatory Visit: Payer: Self-pay | Admitting: Family Medicine

## 2021-06-29 ENCOUNTER — Telehealth: Payer: Self-pay | Admitting: Physical Medicine and Rehabilitation

## 2021-06-29 NOTE — Telephone Encounter (Signed)
Pt called asking for Shena to call in between times 12 and 12:30 on his lunch break. Phone number is [phone]. ?

## 2021-06-30 NOTE — Telephone Encounter (Signed)
Pt is on break til 315 ?

## 2021-07-07 ENCOUNTER — Other Ambulatory Visit: Payer: Self-pay | Admitting: Family Medicine

## 2021-08-01 ENCOUNTER — Ambulatory Visit: Payer: Self-pay

## 2021-08-01 ENCOUNTER — Ambulatory Visit (INDEPENDENT_AMBULATORY_CARE_PROVIDER_SITE_OTHER): Payer: BC Managed Care – PPO | Admitting: Physical Medicine and Rehabilitation

## 2021-08-01 ENCOUNTER — Encounter: Payer: Self-pay | Admitting: Physical Medicine and Rehabilitation

## 2021-08-01 DIAGNOSIS — G8929 Other chronic pain: Secondary | ICD-10-CM

## 2021-08-01 DIAGNOSIS — M25511 Pain in right shoulder: Secondary | ICD-10-CM | POA: Diagnosis not present

## 2021-08-01 NOTE — Progress Notes (Signed)
? ?  Collin Farmer - 61 y.o. male MRN [id number]  Date of birth: 1961/03/24 ? ?Office Visit Note: ?Visit Date: 08/01/2021 ?PCP: Susy Frizzle, MD ?Referred by: Susy Frizzle, MD ? ?Subjective: ?Chief Complaint  ?Patient presents with  ? Right Shoulder - Pain  ? ?HPI:  Collin Farmer is a 61 y.o. male who comes in today at the request of Dr. Eduard Roux for planned Right anesthetic glenohumeral arthrogram with fluoroscopic guidance.  The patient has failed conservative care including home exercise, medications, time and activity modification.  This injection will be diagnostic and hopefully therapeutic.  Please see requesting physician notes for further details and justification.  ? ?ROS Otherwise per HPI. ? ?Assessment & Plan: ?Visit Diagnoses:  ?  ICD-10-CM   ?1. Chronic right shoulder pain  M25.511 Large Joint Inj: R glenohumeral  ? G89.29 XR C-ARM NO REPORT  ?  ?  ?Plan: No additional findings.  ? ?Meds & Orders: No orders of the defined types were placed in this encounter. ?  ?Orders Placed This Encounter  ?Procedures  ? Large Joint Inj: R glenohumeral  ? XR C-ARM NO REPORT  ?  ?Follow-up: Return for visit to requesting provider as needed.  ? ?Procedures: ?Large Joint Inj: R glenohumeral on 08/01/2021 9:44 AM ?Indications: pain and diagnostic evaluation ?Details: 22 G 3.5 in needle, fluoroscopy-guided anteromedial approach ? ?Arthrogram: No ? ?Medications: 40 mg triamcinolone acetonide 40 MG/ML; 5 mL bupivacaine 0.25 % ?Outcome: tolerated well, no immediate complications ? ?There was excellent flow of contrast producing a partial arthrogram of the glenohumeral joint. The patient did have mild relief of symptoms during the anesthetic phase of the injection. ?Procedure, treatment alternatives, risks and benefits explained, specific risks discussed. Consent was given by the patient. Immediately prior to procedure a time out was called to verify the correct patient, procedure, equipment, support staff and site/side  marked as required. Patient was prepped and draped in the usual sterile fashion.  ? ?  ?   ? ?Clinical History: ?No specialty comments available.  ? ? ? ?Objective:  VS:  HT:    WT:   BMI:     BP:   HR: bpm  TEMP: ( )  RESP:  ?Physical Exam  ? ?Imaging: ?No results found. ?

## 2021-08-01 NOTE — Progress Notes (Signed)
Pt state right shoulder pain. Pt state any movement with his right arm makes the pain worse. Pt state he takes over the counter pain meds to help ease his pain. ? ?Numeric Pain Rating Scale and Functional Assessment ?Average Pain 4 ? ? ?In the last MONTH (on 0-10 scale) has pain interfered with the following? ? ?1. General activity like being  able to carry out your everyday physical activities such as walking, climbing stairs, carrying groceries, or moving a chair?  ?Rating(8) ? ? -BT, -Dye Allergies. ? ?

## 2021-08-04 ENCOUNTER — Other Ambulatory Visit: Payer: Self-pay | Admitting: Family Medicine

## 2021-08-04 DIAGNOSIS — I10 Essential (primary) hypertension: Secondary | ICD-10-CM

## 2021-08-05 NOTE — Telephone Encounter (Signed)
Requested Prescriptions  ?Pending Prescriptions Disp Refills  ?? lisinopril-hydrochlorothiazide (ZESTORETIC) 20-12.5 MG tablet [Pharmacy Med Name: LISINOPRIL-HCTZ 20/12.5MG TABLETS] 90 tablet 0  ?  Sig: TAKE 1 TABLET BY MOUTH DAILY  ?  ? Cardiovascular:  ACEI + Diuretic Combos Failed - 08/04/2021  6:53 PM  ?  ?  Failed - Na in normal range and within 180 days  ?  Sodium  ?Date Value Ref Range Status  ?12/03/2020 142 135 - 146 mmol/L Final  ?   ?  ?  Failed - K in normal range and within 180 days  ?  Potassium  ?Date Value Ref Range Status  ?12/03/2020 4.6 3.5 - 5.3 mmol/L Final  ?   ?  ?  Failed - Cr in normal range and within 180 days  ?  Creat  ?Date Value Ref Range Status  ?12/03/2020 0.82 0.70 - 1.35 mg/dL Final  ?   ?  ?  Failed - eGFR is 30 or above and within 180 days  ?  GFR, Est African American  ?Date Value Ref Range Status  ?08/27/2020 112 > OR = 60 mL/min/1.21m Final  ? ?GFR, Est Non African American  ?Date Value Ref Range Status  ?08/27/2020 97 > OR = 60 mL/min/1.79mFinal  ? ?eGFR  ?Date Value Ref Range Status  ?12/03/2020 99 > OR = 60 mL/min/1.7372minal  ?  Comment:  ?  The eGFR is based on the CKD-EPI 2021 equation. To calculate  ?the new eGFR from a previous Creatinine or Cystatin C ?result, go to https://www.kidney.org/professionals/ ?kdoqi/gfr%5Fcalculator ?  ?   ?  ?  Failed - Last BP in normal range  ?  BP Readings from Last 1 Encounters:  ?05/06/21 (!) 148/82  ?   ?  ?  Passed - Patient is not pregnant  ?  ?  Passed - Valid encounter within last 6 months  ?  Recent Outpatient Visits   ?      ? 3 months ago [facility]obese [facility] [place] WarCammie McgeeD  ? 8 months [facility] [place], WarCammie McgeeD  ? 11 months ago [facility]cholesterolemia  [facility] [place], WarCammie McgeeD  ? 1 year ago [facility] for screening for lung cancer  ? [facility]  [place], WarCammie McgeeD  ? 3 years ago Essential hypertension  ? [facility] [place] WarCammie McgeeD  ?  ?  ?Future Appointments   ?        ? In 1 month Xu,Leandrew KoyanagiD CHMRandolph  ? ?  ?  ?  ? ? ?

## 2021-08-11 MED ORDER — BUPIVACAINE HCL 0.25 % IJ SOLN
5.0000 mL | INTRAMUSCULAR | Status: AC | PRN
  Administered 2021-08-01: 5 mL via INTRA_ARTICULAR

## 2021-08-11 MED ORDER — TRIAMCINOLONE ACETONIDE 40 MG/ML IJ SUSP
40.0000 mg | INTRAMUSCULAR | Status: AC | PRN
  Administered 2021-08-01: 40 mg via INTRA_ARTICULAR

## 2021-08-12 ENCOUNTER — Ambulatory Visit (HOSPITAL_COMMUNITY): Payer: BC Managed Care – PPO | Attending: Orthopaedic Surgery

## 2021-08-12 ENCOUNTER — Encounter (HOSPITAL_COMMUNITY): Payer: Self-pay

## 2021-08-12 DIAGNOSIS — G8929 Other chronic pain: Secondary | ICD-10-CM | POA: Diagnosis not present

## 2021-08-12 DIAGNOSIS — R29898 Other symptoms and signs involving the musculoskeletal system: Secondary | ICD-10-CM | POA: Diagnosis not present

## 2021-08-12 DIAGNOSIS — M25511 Pain in right shoulder: Secondary | ICD-10-CM | POA: Diagnosis not present

## 2021-08-12 DIAGNOSIS — M25611 Stiffness of right shoulder, not elsewhere classified: Secondary | ICD-10-CM | POA: Diagnosis not present

## 2021-08-12 NOTE — Therapy (Signed)
?OUTPATIENT OCCUPATIONAL THERAPY ORTHO EVALUATION ? ?Patient Name: Collin Farmer ?MRN: [id number] ?DOB:March 19, 1961, 61 y.o., male ?Today's Date: 08/12/2021 ? ?PCP: Jenna Luo, MD ?REFERRING PROVIDER: Frankey Shown MD ? ? OT End of Session - 08/12/21 1342   ? ? Visit Number 1   ? Number of Visits 2   ? Date for OT Re-Evaluation 08/26/21   ? Authorization Type [employer]   ? Authorization Time Period copay $40, visit limit: 60 OT/PT/ST combined; 0 used. No auth needed   ? Authorization - Visit Number 1   ? Authorization - Number of Visits 60   ? OT Start Time [phone]   ? OT Stop Time 0810   ? OT Time Calculation (min) 35 min   ? Activity Tolerance Patient tolerated treatment well   ? Behavior During Therapy [facility] for tasks assessed/performed   ? ?  ?  ? ?  ? ? ?Past Medical History:  ?Diagnosis Date  ? Diabetes mellitus type 2 in nonobese [facility])   ? HLD (hyperlipidemia)   ? Hypertension   ? Polycythemia   ? Smoking   ? ?History reviewed. No pertinent surgical history. ?Patient Active Problem List  ? Diagnosis Date Noted  ? HLD (hyperlipidemia)   ? Polycythemia   ? Diabetes mellitus type 2 in nonobese [facility])   ? Smoking   ? ? ?ONSET DATE: 1-2 months ? ?REFERRING DIAG: Chronic Right shoulder pain ? ?THERAPY DIAG:  ?Pain in joint of right shoulder ? ?Other symptoms and signs involving the musculoskeletal system ? ?Stiffness of right shoulder joint ? ?SUBJECTIVE:  ? ?SUBJECTIVE STATEMENT: ?S: The shot injection really helped. ?Pt accompanied by: self ? ?PERTINENT HISTORY: Began experiencing right shoulder pain without cause. MRI results show adhesive capsulitis, RTC syndrome, and a partial thickness tear of the supraspinatus. Initially received a subacromial injection and was ineffective. Dr. Erlinda Hong recommended a glenohumeral injection and OT. Received glenohumeral injection performed by Dr. Ernestina Patches on 08/01/21 and reports positive outcome such as increased ROM and decreased pain. ? ?PRECAUTIONS: None ? ?WEIGHT BEARING  RESTRICTIONS No ? ?PAIN:  ?Are you having pain? Yes: NPRS scale: 2/10 ?Pain location: right shoulder, medial aspect ?Pain description: nagging, intermittent ?Aggravating factors: Certain movement: Internal rotation ?Relieving factors: injection ? ?FALLS: Has patient fallen in last 6 months? No ? 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 No overhead lifting. ? ?PLOF: Independent ? ?PATIENT GOALS To increase ROM and continue to decrease pain level. ? ?OBJECTIVE:  ? ?HAND DOMINANCE: Left ? ?ADLs: ?Overall ADLs: Reports that movement is more difficult. Report no difficulty with lifting. Able to complete activities daily and at work. ? ?FUNCTIONAL OUTCOME MEASURES: ?Clinical judgement ? ?UE ROM    ? ?IR/er abducted. Seated. ?Active ROM Right ?08/12/2021  ?Shoulder flexion 130  ?Shoulder abduction 170  ?Shoulder internal rotation 80  ?Shoulder external rotation 35  ?(Blank rows = not tested) ? ? ? ? ?UE MMT:    ? ?MMT Right ?08/12/2021  ?Shoulder flexion 5/5  ?Shoulder abduction 5/5  ?Shoulder internal rotation 5/5  ?Shoulder external rotation 5/5 (pain:3/10)  ?(Blank rows = not tested) ? ? ?COGNITION: ?Overall cognitive status: Within functional limits for tasks assessed ? ? ?OBSERVATIONS: Minimal fascial restrictions noted in the right upper arm/bicep tendon region and upper trapezius.  ? ? ? ? ? ?PATIENT EDUCATION: ?Education details: Shoulder stretches: flexion, doorway, ir. Use of heat for  muscle relaxation  which may be beneficial  prior to stretches.   ?Person educated: Patient ?Education method: Explanation, Demonstration, Verbal cues, and Handouts ?Education comprehension: verbalized understanding and returned demonstration ? ? ?HOME EXERCISE PROGRAM: ?Eval: shoulder stretches: flexion, ir, doorway. ? ?GOALS: ? ? ?SHORT TERM GOALS: Target date: 08/26/2021   ? ?Patient will be educated and independent with  HEP in order to facilitate his progress in therapy and increase the overall functional use of his RUE when completing daily and work activities.  ?Baseline: ?Goal status: INITIAL ? ?2.  Patient will demonstrate an increase in his A/ROM right shoulder flexion and external rotation to functional or normal range allow him to perform higher level over head reaching and reaching behind his head or back with less difficulty.  ?Baseline:  ?Goal status: INITIAL ? ? ? ? ?ASSESSMENT: ? ?CLINICAL IMPRESSION: ?Patient is a 61 y.o. male who was seen today for occupational therapy evaluation for right chronic shoulder pain and partial thickness RTC tear who is primarily experiencing ROM limitations for flexion and external rotation which results in difficulty performing higher level reaching tasks and reaching behind his back or head.  ? ?PERFORMANCE DEFICITS in functional skills including ROM, pain, fascial restrictions, and mobility,. ? ?IMPAIRMENTS are limiting patient from ADLs.  ? ?COMORBIDITIES has no other co-morbidities that affects occupational performance. Patient will benefit from skilled OT to address above impairments and improve overall function. ? ?MODIFICATION OR ASSISTANCE TO COMPLETE EVALUATION: No modification of tasks or assist necessary to complete an evaluation. ? ?OT OCCUPATIONAL PROFILE AND HISTORY: Problem focused assessment: Including review of records relating to presenting problem. ? ?CLINICAL DECISION MAKING: LOW - limited treatment options, no task modification necessary ? ?REHAB POTENTIAL: Excellent ? ?EVALUATION COMPLEXITY: Low ? ? ? ? ? ?PLAN: ?OT FREQUENCY:  Anticipate needing 1 more follow up treatment session ? ?OT DURATION: 2 weeks (returns in 2 weeks) ? ?PLANNED INTERVENTIONS: self care/ADL training, therapeutic exercise, therapeutic activity, neuromuscular re-education, manual therapy, passive range of motion, electrical stimulation, moist heat, and patient/family education ? ? ? ?CONSULTED  AND AGREED WITH PLAN OF CARE: Patient ? ?PLAN FOR NEXT SESSION: P: Anticipate discharge at next session. Follow up with patient on HEP completion and progress with ROM. Discharge with HEP unless there is a concern that needs patient to continue.  ? ? ?Ailene Ravel, OTR/L,CBIS  ?[phone] ? ?08/12/2021, 1:47 PM ?  ?

## 2021-08-12 NOTE — Patient Instructions (Signed)
Complete the following exercises 2-3 times a day. ? ?Doorway Stretch ? ?Place each hand opposite each other on the doorway. (You can change where you feel the stretch by moving arms higher or lower.) ?Step through with one foot and bend front knee until a stretch is felt and hold. ?Step through with the opposite foot on the next rep. ?Hold for __20-30___ seconds. Repeat __2__times.  ? ? ? ?Scapular Retraction (Standing) ? ? ?Internal Rotation Across Back ? ?Grab the end of a towel with your affected side, palm facing backwards. Grab the towel with your unaffected side and pull your affected hand across your back until you feel a stretch in the front of your shoulder. If you feel pain, pull just to the pain, do not pull through the pain. Hold. Return your affected arm to your side. Try to keep your hand/arm close to your body during the entire movement.   ?  Hold for 20-30 seconds. Complete 2 times.    ? ? ? ? ? ?Wall Flexion ? ?Slide your arm up the wall or door frame until a stretch is felt in your shoulder . Hold for 20-30 seconds. Complete 2 times  ? ? ? ? ?

## 2021-08-19 ENCOUNTER — Encounter (HOSPITAL_COMMUNITY): Payer: BC Managed Care – PPO

## 2021-08-25 ENCOUNTER — Telehealth (HOSPITAL_COMMUNITY): Payer: Self-pay | Admitting: Specialist

## 2021-08-25 NOTE — Telephone Encounter (Signed)
Dad just called - he has to cx he as to move his daughter and grandchild and will not be here.

## 2021-08-26 ENCOUNTER — Ambulatory Visit (HOSPITAL_COMMUNITY): Payer: BC Managed Care – PPO | Admitting: Specialist

## 2021-08-26 ENCOUNTER — Encounter (HOSPITAL_COMMUNITY): Payer: BC Managed Care – PPO

## 2021-09-02 ENCOUNTER — Encounter (HOSPITAL_COMMUNITY): Payer: BC Managed Care – PPO | Admitting: Occupational Therapy

## 2021-09-09 ENCOUNTER — Ambulatory Visit: Payer: BC Managed Care – PPO | Admitting: Orthopaedic Surgery

## 2021-10-31 ENCOUNTER — Other Ambulatory Visit: Payer: Self-pay | Admitting: Family Medicine

## 2021-10-31 DIAGNOSIS — I10 Essential (primary) hypertension: Secondary | ICD-10-CM

## 2021-11-01 NOTE — Telephone Encounter (Signed)
Requested medication (s) are due for refill today: Yes  Requested medication (s) are on the active medication list: Yes  Last refill:  08/05/21  Future visit scheduled: No  Notes to clinic:  Protocol indicates pt. Needs lab work.    Requested Prescriptions  Pending Prescriptions Disp Refills   lisinopril-hydrochlorothiazide (ZESTORETIC) 20-12.5 MG tablet [Pharmacy Med Name: LISINOPRIL-HCTZ 20/12.5MG TABLETS] 90 tablet 0    Sig: TAKE 1 TABLET BY MOUTH DAILY     Cardiovascular:  ACEI + Diuretic Combos Failed - 10/31/2021  6:36 AM      Failed - Na in normal range and within 180 days    Sodium  Date Value Ref Range Status  12/03/2020 142 135 - 146 mmol/L Final         Failed - K in normal range and within 180 days    Potassium  Date Value Ref Range Status  12/03/2020 4.6 3.5 - 5.3 mmol/L Final         Failed - Cr in normal range and within 180 days    Creat  Date Value Ref Range Status  12/03/2020 0.82 0.70 - 1.35 mg/dL Final         Failed - eGFR is 30 or above and within 180 days    GFR, Est African American  Date Value Ref Range Status  08/27/2020 112 > OR = 60 mL/min/1.20m Final   GFR, Est Non African American  Date Value Ref Range Status  08/27/2020 97 > OR = 60 mL/min/1.746mFinal   eGFR  Date Value Ref Range Status  12/03/2020 99 > OR = 60 mL/min/1.7364minal    Comment:    The eGFR is based on the CKD-EPI 2021 equation. To calculate  the new eGFR from a previous Creatinine or Cystatin C result, go to https://www.kidney.org/professionals/ kdoqi/gfr%5Fcalculator          Failed - Last BP in normal range    BP Readings from Last 1 Encounters:  05/06/21 (!) 148/82         Passed - Patient is not pregnant      Passed - Valid encounter within last 6 months    Recent Outpatient Visits           5 months ago [facility] [place] [facility]   11 months ago [facility]ob[facility] [place] [place]   1 year ago Pure hypercholesterolemia   [place], [place] Mcge[place]   2 years ago Encounter for screening for lung cancer   [place], [place] Mcgee[place]   3 years ago Essential hypertension   [place]

## 2021-12-14 ENCOUNTER — Other Ambulatory Visit: Payer: Self-pay | Admitting: Family Medicine

## 2021-12-15 ENCOUNTER — Other Ambulatory Visit: Payer: Self-pay

## 2021-12-15 ENCOUNTER — Other Ambulatory Visit: Payer: Self-pay | Admitting: Family Medicine

## 2021-12-15 DIAGNOSIS — E7800 Pure hypercholesterolemia, unspecified: Secondary | ICD-10-CM

## 2021-12-15 DIAGNOSIS — I10 Essential (primary) hypertension: Secondary | ICD-10-CM

## 2021-12-15 MED ORDER — ATORVASTATIN CALCIUM 20 MG PO TABS: ORAL_TABLET | ORAL | 0 refills | Status: DC

## 2021-12-15 NOTE — Telephone Encounter (Signed)
Unable to refill per protocol, request too soon. Last RF 02/07/21 for 90 and 3 RF.Receipt confirmed by pharmacy (02/07/2021 12:20 PM . Should have enough through October. Will refuse.  Requested Prescriptions  Pending Prescriptions Disp Refills  . atorvastatin (LIPITOR) 20 MG tablet [Pharmacy Med Name: ATORVASTATIN '20MG'$  TABLETS] 90 tablet 3    Sig: TAKE 1 TABLET(20 MG) BY MOUTH DAILY     Cardiovascular:  Antilipid - Statins Failed - 12/14/2021  7:23 PM      Failed - Lipid Panel in normal range within the last 12 months    Cholesterol  Date Value Ref Range Status  12/03/2020 134 <200 mg/dL Final   LDL Cholesterol (Calc)  Date Value Ref Range Status  12/03/2020 69 mg/dL (calc) Final    Comment:    Reference range: <100 . Desirable range <100 mg/dL for primary prevention;   <70 mg/dL for patients with CHD or diabetic patients  with > or = 2 CHD risk factors. Marland Kitchen LDL-C is now calculated using the Martin-Hopkins  calculation, which is a validated novel method providing  better accuracy than the Friedewald equation in the  estimation of LDL-C.  Cresenciano Genre et al. Annamaria Helling. 9381;017(51): 2061-2068  (http://education.[employer].com/faq/FAQ164)    HDL  Date Value Ref Range Status  12/03/2020 48 > OR = 40 mg/dL Final   Triglycerides  Date Value Ref Range Status  12/03/2020 90 <150 mg/dL Final         Passed - Patient is not pregnant      Passed - Valid encounter within last 12 months    Recent Outpatient Visits          7 months ago Diabetes mellitus type 2 in nonobese [facility])   [place] Susy Frizzle, MD   1 year ago Diabetes mellitus type 2 in nonobese [facility])   [place] Pickard, Cammie Mcgee, MD   1 year ago Pure hypercholesterolemia   [place] Pickard, Cammie Mcgee, MD   2 years ago Encounter for screening for lung cancer   [place] Pickard, Cammie Mcgee, MD   3 years ago Essential hypertension   [place], Cammie Mcgee, MD

## 2021-12-23 ENCOUNTER — Other Ambulatory Visit: Payer: Self-pay | Admitting: Family Medicine

## 2021-12-23 NOTE — Telephone Encounter (Signed)
Requested Prescriptions  Pending Prescriptions Disp Refills  . glipiZIDE (GLUCOTROL XL) 5 MG 24 hr tablet [Pharmacy Med Name: GLIPIZIDE ER '5MG'$  TABLETS] 90 tablet 0    Sig: TAKE 1 TABLET(5 MG) BY MOUTH DAILY WITH BREAKFAST     Endocrinology:  Diabetes - Sulfonylureas Failed - 12/23/2021  2:38 PM      Failed - HBA1C is between 0 and 7.9 and within 180 days    Hgb A1c MFr Bld  Date Value Ref Range Status  05/06/2021 7.1 (H) <5.7 % of total Hgb Final    Comment:    For someone without known diabetes, a hemoglobin A1c value of 6.5% or greater indicates that they may have  diabetes and this should be confirmed with a follow-up  test. . For someone with known diabetes, a value <7% indicates  that their diabetes is well controlled and a value  greater than or equal to 7% indicates suboptimal  control. A1c targets should be individualized based on  duration of diabetes, age, comorbid conditions, and  other considerations. . Currently, no consensus exists regarding use of hemoglobin A1c for diagnosis of diabetes for children. .          Failed - Cr in normal range and within 360 days    Creat  Date Value Ref Range Status  12/03/2020 0.82 0.70 - 1.35 mg/dL Final         Failed - Valid encounter within last 6 months    Recent Outpatient Visits          7 months ago Diabetes mellitus type 2 in nonobese [facility])   [place] Susy Frizzle, MD   1 year ago Diabetes mellitus type 2 in nonobese [facility])   [place] Pickard, Cammie Mcgee, MD   1 year ago Pure hypercholesterolemia   [place] Pickard, Cammie Mcgee, MD   2 years ago Encounter for screening for lung cancer   [place] Pickard, Cammie Mcgee, MD   3 years ago Essential hypertension   [place], Cammie Mcgee, MD      Future Appointments            In 4 weeks Pickard, Cammie Mcgee, MD [place]

## 2021-12-28 ENCOUNTER — Other Ambulatory Visit: Payer: Self-pay | Admitting: Family Medicine

## 2021-12-28 NOTE — Telephone Encounter (Signed)
Requested medication (s) are due for refill today:   Yes  Requested medication (s) are on the active medication list:   Yes  Future visit scheduled:   Yes 01/20/2022   Last ordered: 07/07/2021 #90, 1 refill  Returned for provider review for refills prior to upcoming appt.  Note that needed OV for further refills.   Requested Prescriptions  Pending Prescriptions Disp Refills   FARXIGA 10 MG TABS tablet [Pharmacy Med Name: FARXIGA 10MG TABLETS] 90 tablet 1    Sig: TAKE 1 TABLET BY MOUTH DAILY     Endocrinology:  Diabetes - SGLT2 Inhibitors Failed - 12/28/2021  6:33 AM      Failed - Cr in normal range and within 360 days    Creat  Date Value Ref Range Status  12/03/2020 0.82 0.70 - 1.35 mg/dL Final         Failed - HBA1C is between 0 and 7.9 and within 180 days    Hgb A1c MFr Bld  Date Value Ref Range Status  05/06/2021 7.1 (H) <5.7 % of total Hgb Final    Comment:    For someone without known diabetes, a hemoglobin A1c value of 6.5% or greater indicates that they may have  diabetes and this should be confirmed with a follow-up  test. . For someone with known diabetes, a value <7% indicates  that their diabetes is well controlled and a value  greater than or equal to 7% indicates suboptimal  control. A1c targets should be individualized based on  duration of diabetes, age, comorbid conditions, and  other considerations. . Currently, no consensus exists regarding use of hemoglobin A1c for diagnosis of diabetes for children. .          Failed - eGFR in normal range and within 360 days    GFR, Est African American  Date Value Ref Range Status  08/27/2020 112 > OR = 60 mL/min/1.29m Final   GFR, Est Non African American  Date Value Ref Range Status  08/27/2020 97 > OR = 60 mL/min/1.738mFinal   eGFR  Date Value Ref Range Status  12/03/2020 99 > OR = 60 mL/min/1.7354minal    Comment:    The eGFR is based on the CKD-EPI 2021 equation. To calculate  the new eGFR from  a previous Creatinine or Cystatin C result, go to https://www.kidney.org/professionals/ kdoqi/gfr%5Fcalculator          Failed - Valid encounter within last 6 months    Recent Outpatient Visits           7 months ago [facility]   1 year ago [facility] [place]ge[facility]   1 year ago Pure hypercholesterolemia   [place]   2 years ago Encounter for screening for lung cancer   [place]   3 years ago Essential hypertension   [place]       Future Appointments             In 3 weeks Pickard, [place]

## 2022-01-20 ENCOUNTER — Encounter: Payer: BC Managed Care – PPO | Admitting: Family Medicine

## 2022-01-27 ENCOUNTER — Other Ambulatory Visit: Payer: Self-pay | Admitting: Family Medicine

## 2022-01-27 DIAGNOSIS — I10 Essential (primary) hypertension: Secondary | ICD-10-CM

## 2022-01-27 DIAGNOSIS — E7800 Pure hypercholesterolemia, unspecified: Secondary | ICD-10-CM

## 2022-02-17 ENCOUNTER — Telehealth: Payer: Self-pay

## 2022-02-17 ENCOUNTER — Other Ambulatory Visit: Payer: Self-pay

## 2022-02-17 DIAGNOSIS — R1310 Dysphagia, unspecified: Secondary | ICD-10-CM

## 2022-02-17 NOTE — Telephone Encounter (Signed)
Pt called in stating that he has been diagnosed with dysphagia while out of town. Pt states that he has been on a liquid diet as he is unable to swallow any food. Pt wanted to know if he could get a referral to a GI please. Please advise.  Cb#: [phone]

## 2022-02-20 ENCOUNTER — Encounter (INDEPENDENT_AMBULATORY_CARE_PROVIDER_SITE_OTHER): Payer: Self-pay | Admitting: *Deleted

## 2022-02-20 NOTE — Telephone Encounter (Signed)
Pt called in today to check on referral to GI. Pt would like to see a specialist in [place] if this is possible. Please give pt a cb about where he would be able to go with this referral please.  Cb#: [phone]

## 2022-02-28 NOTE — Progress Notes (Unsigned)
GI Office Note    Referring Provider: Susy Frizzle, MD Primary Care Physician:  Susy Frizzle, MD  Primary Gastroenterologist: Elon Alas. Abbey Chatters, DO  Chief Complaint   Chief Complaint  Patient presents with   Dysphagia    Food getting stuck in throat    History of Present Illness   Collin Farmer is a 61 y.o. male presenting today at the request of Susy Frizzle, MD for dysphagia.  Last colonoscopy in October 2011 by Dr. Carlean Purl with [facility]: -2 rectal polyps, 3 mm and 6-7 mm (1 polyp was adenomatous) -Moderate sigmoid diverticulosis -Mildly large prostate with nodules -Advised follow-up with Dr. Dennard Schaumann for prostate -Advise repeat colonoscopy in 5 years  Seen in the ED at [place] in [place] 02/16/22 with dysphagia for 5 days.  Reporting he is unable to eat solid food for 5 days.  Able to swallow his own secretions, liquids, and Jell-O.  Patient stated " just cannot do it".  Also reported some lightheadedness with standing.  Hemoglobin 19.3, hematocrit 55, likely secondary to dehydration.  WBC 14.4.  Potassium 5.5, creatinine 1.19, glucose 387.  Initially tachycardic on arrival but improved with IV fluids.  Given a dose of IV antibiotics given high heart rate.  CT of his neck ordered.  Patient reported he is afebrile. 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 Went to the ED 11/16 in [place]. Noticed that the first day he tried to swallow a normal piece of food and had to spit up food. Was not able to help it go down with liquids. Able to eat very small bits of chicken in chicken noodle soup and small bites of meats like hamburger helper and meatloaf. Not starving but not getting complete meals. Has been filling up on liquids. Primarily eating pudding and jello's,,  yogurt, soups, mac n cheese, mashed potatoes. States he had lost about 24 lbs. Usually runs around 200 lbs. Weight loss has been since the dysphagia began.  Denies any abdominal pain, diarrhea, nausea, vomiting, lack of appetite.  Has been dizzy lately as well. Stumbling as he feels dehydrated. Reports last night he sad down to have a bowel movement and noticed he had blood in his stool. Stool was dark red. This morning he had a small amount as well. Did sit there for a long time to use the bathroom. Has been having a little constipation. Felt nauseas on the toilet as well. Denies overt PICA but does want very cold drinks and ice cream. Has been having some more fatigue and some blurry vision.   Used to smoke 1 pack per day and now he is down to 3-4 cigarettes a day. No alcohol. No drug use.  Takes glipizide in the evening. Metformin twice per day. Farxiga and Actos in the morning.   Drinking about 1 protein shake a day or 1 ensure daily.   Current Outpatient Medications  Medication Sig Dispense Refill   aspirin 81 MG tablet Take 81 mg by mouth daily.     atorvastatin (LIPITOR) 20 MG tablet TAKE 1 TABLET BY MOUTH DAILY. PHYSICAL APPOINTMENT REQUIRED WITH PRESCRIBER FOR REFILLS 90 tablet 3   FARXIGA 10 MG TABS tablet TAKE 1 TABLET BY MOUTH DAILY 90 tablet 1   glipiZIDE (GLUCOTROL XL) 5 MG 24 hr tablet TAKE 1  TABLET(5 MG) BY MOUTH DAILY WITH BREAKFAST 90 tablet 0   lisinopril-hydrochlorothiazide (ZESTORETIC) 20-12.5 MG tablet TAKE 1 TABLET BY MOUTH DAILY 90 tablet 0   metFORMIN (GLUCOPHAGE) 500 MG tablet TAKE 2 TABLETS(1000 MG) BY MOUTH TWICE DAILY WITH A MEAL 120 tablet 5   pioglitazone (ACTOS) 30 MG tablet Take 1 tablet (30 mg total) by mouth daily. 90 tablet 3   No current facility-administered medications for this visit.    Past Medical History:  Diagnosis Date   Diabetes mellitus type 2 in nonobese (HCC)    HLD (hyperlipidemia)    Hypertension    Polycythemia    Smoking     History  reviewed. No pertinent surgical history.  History reviewed. No pertinent family history.  Allergies as of 03/01/2022 - Review Complete 03/01/2022  Allergen Reaction Noted   Bee venom Anaphylaxis 12/29/2013   Other  06/29/2012    Social History   Socioeconomic History   Marital status: Single    Spouse name: Not on file   Number of children: Not on file   Years of education: Not on file   Highest education level: Not on file  Occupational History   Not on file  Tobacco Use   Smoking status: Every Day    Packs/day: 0.50    Years: 37.00    Total pack years: 18.50    Types: Cigarettes   Smokeless tobacco: Never  Substance and Sexual Activity   Alcohol use: No    Alcohol/week: 0.0 standard drinks of alcohol   Drug use: No   Sexual activity: Never    Birth control/protection: Abstinence  Other Topics Concern   Not on file  Social History Narrative   Not on file   Social Determinants of Health   Financial Resource Strain: Not on file  Food Insecurity: Not on file  Transportation Needs: Not on file  Physical Activity: Not on file  Stress: Not on file  Social Connections: Not on file  Intimate Partner Violence: Not on file     Review of Systems   Gen: Denies any fever, chills, fatigue, weight loss, lack of appetite.  CV: Denies chest pain, heart palpitations, peripheral edema, syncope.  Resp: Denies shortness of breath at rest or with exertion. Denies wheezing or cough.  GI: see HPI GU : Denies urinary burning, urinary frequency, urinary hesitancy MS: Denies joint pain, muscle weakness, cramps, or limitation of movement.  Derm: Denies rash, itching, dry skin Psych: Denies depression, anxiety, memory loss, and confusion Heme: Denies bruising, bleeding, and enlarged lymph nodes.   Physical Exam   BP 137/75 (BP Location: Right Arm, Patient Position: Sitting, Cuff Size: Normal)   Pulse (!) 110   Temp 97.7 F (36.5 C) (Temporal)   Ht _0  (1.753 m)   Wt 180 lb  9.6 oz (81.9 kg)   SpO2 97%   BMI 26.67 kg/m   General:   Alert and oriented. Pleasant and cooperative. Well-nourished and well-developed.  Head:  Normocephalic and atraumatic. Eyes:  Without icterus, sclera clear and conjunctiva pink.  Ears:  Normal auditory acuity. Mouth:  No deformity or lesions, oral mucosa pink.  Lungs:  Clear to auscultation bilaterally. No wheezes, rales, or rhonchi. No distress.  Heart:  S1, S2 present without murmurs appreciated.  Abdomen:  +BS, soft, non-tender and non-distended. No HSM noted. No guarding or rebound. No masses appreciated.  Rectal: good tone, POC FOBT positive. No rectal mass identified.  Msk:  Symmetrical without gross deformities. Normal posture. Extremities:  Without edema. Neurologic:  Alert and  oriented x4;  grossly normal neurologically. Skin:  Intact without significant lesions or rashes. Psych:  Alert and cooperative. Normal mood and affect.   Assessment   Collin Farmer is a 61 y.o. male with a history of diabetes, HLD, HTN, and tobacco use presenting today for evaluation of dysphagia.  Dysphagia: [place] trouble swallowing solids for 5 days.  Underwent CT soft tissue neck without any abnormalities.  No swallow study attempted.  He was treated for tachycardia and leukocytosis with IV fluids and a dose of antibiotics.  Primarily issues with solids, not able to eat regular foods sandy just is not able to swallow and feels like it is stuck in the middle/bottom of his chest.  Has had to regurgitate food when this began.  Primarily has been sticking to liquids and very soft diet.  He is able to tolerate chicken that is in soup and ground meat without much issue.  Also has been experiencing some fatigue and due to his change in diet he has lost about 20 pounds in the last 2-3 weeks.  No issues with weight loss prior to swallowing issues.  Given his significant issues, we will proceed with EGD as soon as possible with  dilation.  If dysphagia continues afterward we will consider a BPE.  Constipation, Rectal bleeding, Fatigue: Dark red stool last night and today. Could be secondary to constipation given hard stools and some straining. No obvious hemorrhoids on exam.  Has been having some constipation since his change in diet with his swallowing issues.  Denies any issues prior to this, would have regular soft BMs daily.  POC FOBT positive today on exam with good rectal tone and no evidence of any rectal mass.  No obvious hemorrhoids noted on exam.  Has been experiencing some fatigue as well as occasional dizziness and polydipsia.  Last hemoglobin was elevated at 19 which I suspect be related to dehydration, will recheck today as well as an anemia panel given his recent rectal bleeding.  Given his rectal bleeding and constipation and that he is due for screening for colon cancer, we will proceed with colonoscopy in the near future.  Screening for colon cancer: Last colonoscopy in 2011 at [facility] with a tubular adenoma in the rectum.  Advised a 5-year follow-up.  Currently overdue, in the setting of constipation and rectal bleeding we will proceed with colonoscopy.  PLAN   Proceed with colonoscopy and upper endoscopy with dilation with propofol by Dr. Abbey Chatters in near future: the risks, benefits, and alternatives have been discussed with the patient in detail. The patient states understanding and desires to proceed. ASA 2 (ASAP) Bmp pre-op Hold glipizide the night prior to procedure Hold metformin night prior to and morning of procedure Hold Actos the morning of your procedure You need to hold Farxiga for 3 days prior to procedure. Continue soft/liquid diet until EGD ED precautions discussed Protein supplement daily.  CBC, CMP, iron panel, B12, folate Stool softener daily, may use MiraLAX if needed. Follow up with PCP regarding blood glucose management. Follow up 2 months post procedure.    Venetia Night,  MSN, FNP-BC, AGACNP-BC [facility]

## 2022-03-01 ENCOUNTER — Encounter: Payer: Self-pay | Admitting: Gastroenterology

## 2022-03-01 ENCOUNTER — Telehealth: Payer: Self-pay | Admitting: *Deleted

## 2022-03-01 ENCOUNTER — Encounter: Payer: Self-pay | Admitting: *Deleted

## 2022-03-01 ENCOUNTER — Ambulatory Visit: Payer: BC Managed Care – PPO | Admitting: Gastroenterology

## 2022-03-01 ENCOUNTER — Encounter (HOSPITAL_COMMUNITY): Payer: Self-pay

## 2022-03-01 VITALS — BP 137/75 | HR 110 | Temp 97.7°F | Ht 69.0 in | Wt 180.6 lb

## 2022-03-01 DIAGNOSIS — R1310 Dysphagia, unspecified: Secondary | ICD-10-CM

## 2022-03-01 DIAGNOSIS — Z1211 Encounter for screening for malignant neoplasm of colon: Secondary | ICD-10-CM

## 2022-03-01 DIAGNOSIS — R5383 Other fatigue: Secondary | ICD-10-CM

## 2022-03-01 DIAGNOSIS — K625 Hemorrhage of anus and rectum: Secondary | ICD-10-CM | POA: Diagnosis not present

## 2022-03-01 DIAGNOSIS — K5900 Constipation, unspecified: Secondary | ICD-10-CM

## 2022-03-01 MED ORDER — PEG 3350-KCL-NA BICARB-NACL 420 G PO SOLR: 4000.0000 mL | Freq: Once | ORAL | 0 refills | Status: AC

## 2022-03-01 NOTE — Patient Instructions (Addendum)
Schedule you for a colonoscopy and upper endoscopy with possible dilation with Dr. Abbey Chatters in the near future.  You will receive separate written instructions in the mail regarding adjustments to your medications for the procedure.  Your colon prep will be sent to your pharmacy.  Morning labs treated completed at [facility] which is across the street from [facility].  Continue to eat a softer/liquid diet to EGD.  If you eat more solid foods, please be mindful to eat more moist foods and cut into very small pieces and adequately chew prior to swallowing.  Continue an Ensure or a protein shake daily, he may increase to twice daily if needed.  If you begin to have any issues swallowing and not able to tolerate liquids afterward, please head to the ED for possible food impaction.  For some constipation, please begin taking a daily stool softener such as Colace, generic is fine.  If you do not have relief with this after a few days to 1 week, you may begin 1 capful of MiraLAX daily.  If you begin to feel lightheaded, dizzy, or like you may pass out, please head to the ED for further evaluation.  I will be in touch with any further recommendations after I receive your lab work.   It was a pleasure to see you today. I want to create trusting relationships with patients. If you receive a survey regarding your visit,  I greatly appreciate you taking time to fill this out on paper or through your MyChart. I value your feedback.  Venetia Night, MSN, FNP-BC, AGACNP-BC [facility]

## 2022-03-01 NOTE — Telephone Encounter (Signed)
Pt informed that he will get a pre-op telephone call tomorrow 03/02/22 from pre-op nurse from [facility], verbalized understanding

## 2022-03-02 ENCOUNTER — Encounter (HOSPITAL_COMMUNITY)
Admission: RE | Admit: 2022-03-02 | Discharge: 2022-03-02 | Disposition: A | Discharge status: Home / Self Care | Payer: BC Managed Care – PPO | Source: Ambulatory Visit | Attending: Internal Medicine | Admitting: Internal Medicine

## 2022-03-02 ENCOUNTER — Emergency Department (HOSPITAL_COMMUNITY)
Admission: EM | Admit: 2022-03-02 | Discharge: 2022-03-02 | Disposition: A | Discharge status: Home / Self Care | Payer: BC Managed Care – PPO | Attending: Emergency Medicine | Admitting: Emergency Medicine

## 2022-03-02 ENCOUNTER — Other Ambulatory Visit: Payer: Self-pay

## 2022-03-02 DIAGNOSIS — Z794 Long term (current) use of insulin: Secondary | ICD-10-CM | POA: Diagnosis not present

## 2022-03-02 DIAGNOSIS — Z87891 Personal history of nicotine dependence: Secondary | ICD-10-CM | POA: Diagnosis not present

## 2022-03-02 DIAGNOSIS — R739 Hyperglycemia, unspecified: Secondary | ICD-10-CM

## 2022-03-02 DIAGNOSIS — E1165 Type 2 diabetes mellitus with hyperglycemia: Secondary | ICD-10-CM | POA: Diagnosis not present

## 2022-03-02 DIAGNOSIS — I10 Essential (primary) hypertension: Secondary | ICD-10-CM | POA: Insufficient documentation

## 2022-03-02 DIAGNOSIS — Z7982 Long term (current) use of aspirin: Secondary | ICD-10-CM | POA: Diagnosis not present

## 2022-03-02 DIAGNOSIS — D72829 Elevated white blood cell count, unspecified: Secondary | ICD-10-CM | POA: Diagnosis not present

## 2022-03-02 DIAGNOSIS — Z7984 Long term (current) use of oral hypoglycemic drugs: Secondary | ICD-10-CM | POA: Diagnosis not present

## 2022-03-02 HISTORY — DX: Personal history of urinary calculi: Z87.442

## 2022-03-02 LAB — BLOOD GAS, VENOUS
Acid-Base Excess: 7.2 mmol/L — ABNORMAL HIGH (ref 0.0–2.0)
Bicarbonate: 34.2 mmol/L — ABNORMAL HIGH (ref 20.0–28.0)
Drawn by: 7012
O2 Saturation: 28.6 %
Patient temperature: 36.4
pCO2, Ven: 53 mmHg (ref 44–60)
pH, Ven: 7.42 (ref 7.25–7.43)
pO2, Ven: <31 mmHg — CL (ref 32–45)

## 2022-03-02 LAB — COMPREHENSIVE METABOLIC PANEL
AG Ratio: 1.6 (calc) (ref 1.0–2.5)
ALT: 81 U/L — ABNORMAL HIGH (ref 9–46)
ALT: 67 U/L — ABNORMAL HIGH (ref 0–44)
AST: 33 U/L (ref 10–35)
AST: 33 U/L (ref 15–41)
Albumin: 4.4 g/dL (ref 3.6–5.1)
Albumin: 3.9 g/dL (ref 3.5–5.0)
Alkaline Phosphatase: 146 U/L — ABNORMAL HIGH (ref 38–126)
Alkaline phosphatase (APISO): 173 U/L — ABNORMAL HIGH (ref 35–144)
Anion gap: 13 (ref 5–15)
BUN: 23 mg/dL (ref 7–25)
BUN: 28 mg/dL — ABNORMAL HIGH (ref 8–23)
CO2: 25 mmol/L (ref 20–32)
CO2: 24 mmol/L (ref 22–32)
Calcium: 10.9 mg/dL — ABNORMAL HIGH (ref 8.6–10.3)
Calcium: 9.5 mg/dL (ref 8.9–10.3)
Chloride: 94 mmol/L — ABNORMAL LOW (ref 98–110)
Chloride: 95 mmol/L — ABNORMAL LOW (ref 98–111)
Creat: 1.13 mg/dL (ref 0.70–1.35)
Creatinine, Ser: 1.13 mg/dL (ref 0.61–1.24)
GFR, Estimated: >60 mL/min (ref >60)
Globulin: 2.8 g/dL (calc) (ref 1.9–3.7)
Glucose, Bld: 680 mg/dL (ref 65–99)
Glucose, Bld: 754 mg/dL (ref 70–99)
Potassium: 4.8 mmol/L (ref 3.5–5.3)
Potassium: 4.6 mmol/L (ref 3.5–5.1)
Sodium: 135 mmol/L (ref 135–146)
Sodium: 132 mmol/L — ABNORMAL LOW (ref 135–145)
Total Bilirubin: 0.5 mg/dL (ref 0.2–1.2)
Total Bilirubin: 1 mg/dL (ref 0.3–1.2)
Total Protein: 7.2 g/dL (ref 6.1–8.1)
Total Protein: 7.3 g/dL (ref 6.5–8.1)

## 2022-03-02 LAB — URINALYSIS, ROUTINE W REFLEX MICROSCOPIC
Bacteria, UA: NONE SEEN
Bilirubin Urine: NEGATIVE
Glucose, UA: >=500 mg/dL — AB
Hgb urine dipstick: NEGATIVE
Ketones, ur: 5 mg/dL — AB
Leukocytes,Ua: NEGATIVE
Nitrite: NEGATIVE
Protein, ur: NEGATIVE mg/dL
Specific Gravity, Urine: 1.029 (ref 1.005–1.030)
pH: 6 (ref 5.0–8.0)

## 2022-03-02 LAB — CBC
HCT: 58.1 % — ABNORMAL HIGH (ref 38.5–50.0)
HCT: 55.2 % — ABNORMAL HIGH (ref 39.0–52.0)
Hemoglobin: 19.2 g/dL — ABNORMAL HIGH (ref 13.2–17.1)
Hemoglobin: 19.1 g/dL — ABNORMAL HIGH (ref 13.0–17.0)
MCH: 29.9 pg (ref 27.0–33.0)
MCH: 30.4 pg (ref 26.0–34.0)
MCHC: 33 g/dL (ref 32.0–36.0)
MCHC: 34.6 g/dL (ref 30.0–36.0)
MCV: 90.5 fL (ref 80.0–100.0)
MCV: 87.9 fL (ref 80.0–100.0)
MPV: 11.6 fL (ref 7.5–12.5)
Platelets: 241 10*3/uL (ref 140–400)
Platelets: 219 10*3/uL (ref 150–400)
RBC: 6.42 10*6/uL — ABNORMAL HIGH (ref 4.20–5.80)
RBC: 6.28 MIL/uL — ABNORMAL HIGH (ref 4.22–5.81)
RDW: 12.3 % (ref 11.0–15.0)
RDW: 13.8 % (ref 11.5–15.5)
WBC: 14.8 10*3/uL — ABNORMAL HIGH (ref 3.8–10.8)
WBC: 12.6 10*3/uL — ABNORMAL HIGH (ref 4.0–10.5)
nRBC: 0 % (ref 0.0–0.2)

## 2022-03-02 LAB — CBG MONITORING, ED
Glucose-Capillary: >600 mg/dL (ref 70–99)
Glucose-Capillary: 510 mg/dL (ref 70–99)
Glucose-Capillary: >600 mg/dL (ref 70–99)
Glucose-Capillary: 310 mg/dL — ABNORMAL HIGH (ref 70–99)

## 2022-03-02 LAB — LACTIC ACID, PLASMA
Lactic Acid, Venous: 2.3 mmol/L (ref 0.5–1.9)
Lactic Acid, Venous: 2.8 mmol/L (ref 0.5–1.9)
Lactic Acid, Venous: 1.8 mmol/L (ref 0.5–1.9)

## 2022-03-02 LAB — IRON,TIBC AND FERRITIN PANEL
%SAT: 15 % (calc) — ABNORMAL LOW (ref 20–48)
Ferritin: 564 ng/mL — ABNORMAL HIGH (ref 24–380)
Iron: 47 ug/dL — ABNORMAL LOW (ref 50–180)
TIBC: 324 mcg/dL (calc) (ref 250–425)

## 2022-03-02 LAB — B12 AND FOLATE PANEL
Folate: 12.1 ng/mL
Vitamin B-12: 1084 pg/mL (ref 200–1100)

## 2022-03-02 LAB — LIPASE, BLOOD: Lipase: 48 U/L (ref 11–51)

## 2022-03-02 MED ORDER — DEXTROSE 50 % IV SOLN: 0.0000 mL | INTRAVENOUS | Status: DC | PRN

## 2022-03-02 MED ORDER — LACTATED RINGERS IV BOLUS
1000.0000 mL | Freq: Once | INTRAVENOUS | Status: AC
  Administered 2022-03-02: 1000 mL via INTRAVENOUS

## 2022-03-02 MED ORDER — INSULIN ASPART 100 UNIT/ML IJ SOLN: 15.0000 [IU] | Freq: Once | INTRAMUSCULAR | Status: DC

## 2022-03-02 MED ORDER — INSULIN ASPART 100 UNIT/ML IJ SOLN
15.0000 [IU] | Freq: Once | INTRAMUSCULAR | Status: AC
  Administered 2022-03-02: 15 [IU] via INTRAVENOUS
  Filled 2022-03-02: qty 1

## 2022-03-02 MED ORDER — SODIUM CHLORIDE 0.9 % IV BOLUS
1000.0000 mL | Freq: Once | INTRAVENOUS | Status: AC
  Administered 2022-03-02: 1000 mL via INTRAVENOUS

## 2022-03-02 MED ORDER — LACTATED RINGERS IV BOLUS
2000.0000 mL | Freq: Once | INTRAVENOUS | Status: AC
  Administered 2022-03-02: 2000 mL via INTRAVENOUS

## 2022-03-02 NOTE — ED Notes (Signed)
2.3 lactic acid communicated with the PA via face to face communication

## 2022-03-02 NOTE — ED Notes (Signed)
PA notified re: pO2 <31 via face to face communication

## 2022-03-02 NOTE — ED Notes (Signed)
Pt c/o dizziness, mostly with standing but quickly gets his balance, c/o double vision as well. See triage notes. Pt a/o. Color wnl. Urinal given

## 2022-03-02 NOTE — Discharge Instructions (Addendum)
Your workup today showed she had elevated blood sugar of about 750.  He did not have any concerning lab findings combined with this that would require admission and continuous insulin drip.  You are given a dose of insulin, lots of fluids.  Your blood sugar improved to about 300.  Continue taking your home diabetic medications.  Continue to track your blood sugar at home with the monitor strips that you want.  We did go over the procedure of checking your blood sugars together in the emergency department.  If it drops significantly low below 65 for the did not feel well please come to the emergency room.  Otherwise eat or drink something to increase your blood sugar.  If it becomes too high and you are unable to get in with your primary care provider or if it is high and you do not feel well please come to the emergency room.  You can reach out to your gastroenterologist for them to review your workup in the emergency department and see when they want to schedule your procedure for.  Please call your primary care provider tomorrow to schedule an in office appointment.  You will likely need to be started on insulin.

## 2022-03-02 NOTE — ED Triage Notes (Addendum)
Pt via POV after seeing GI provider yesterday. He received a call this morning letting him know his blood sugar is high and he needs to come to ER for eval. Pt is type 2 DM but does not check his blood sugar at home. CBG reads HIGH in triage. Pt states he has been unusually thirsty and has been voiding frequently. Poor appetite and some generalized fatigue more noticeable this week.

## 2022-03-02 NOTE — ED Provider Notes (Signed)
[facility] EMERGENCY DEPARTMENT Provider Note   CSN: [id number] Arrival date & time: 03/02/22  1340     History  Chief Complaint  Patient presents with   Abnormal Lab    Collin Farmer is a 61 y.o. male.  61 year old male with past medical history significant for diabetes, dysphagia presents today for evaluation of elevated blood sugar.  He was being evaluated by gastroenterology and had preprocedure labs done which demonstrated that his blood sugar was 680.  They recommended he come to the ED to have this evaluated.  He states he is a known diabetic and takes metformin.  He does not have any glucose monitoring supplies and was never instructed to check his blood sugar.  He states as far as he was aware his blood sugar was well-controlled.  He is complaining of excessive thirst, urinary frequency, and fatigue for the past 2 weeks.  Denies productive cough, chest pain, shortness of breath, abdominal pain, nausea, vomiting, dysuria.  The history is provided by the patient. No language interpreter was used.       Home Medications Prior to Admission medications   Medication Sig Start Date End Date Taking? Authorizing Provider  aspirin 81 MG tablet Take 81 mg by mouth daily.   Yes [provider]  atorvastatin (LIPITOR) 20 MG tablet TAKE 1 TABLET BY MOUTH DAILY. PHYSICAL APPOINTMENT REQUIRED WITH PRESCRIBER FOR REFILLS 01/27/22  Yes Susy Frizzle, MD  FARXIGA 10 MG TABS tablet TAKE 1 TABLET BY MOUTH DAILY 12/28/21  Yes Susy Frizzle, MD  glipiZIDE (GLUCOTROL XL) 5 MG 24 hr tablet TAKE 1 TABLET(5 MG) BY MOUTH DAILY WITH BREAKFAST Patient taking differently: Take 5 mg by mouth every evening. 12/23/21  Yes Susy Frizzle, MD  lisinopril-hydrochlorothiazide (ZESTORETIC) 20-12.5 MG tablet TAKE 1 TABLET BY MOUTH DAILY 01/27/22  Yes Susy Frizzle, MD  metFORMIN (GLUCOPHAGE) 500 MG tablet TAKE 2 TABLETS(1000 MG) BY MOUTH TWICE DAILY WITH A MEAL Patient taking differently:  Take 1,000 mg by mouth 2 (two) times daily with a meal. 07/07/21  Yes Susy Frizzle, MD  pioglitazone (ACTOS) 30 MG tablet Take 1 tablet (30 mg total) by mouth daily. 03/22/21  Yes Susy Frizzle, MD  buPROPion (WELLBUTRIN XL) 150 MG 24 hr tablet Take 150 mg by mouth daily. Patient not taking: Reported on 03/02/2022 03/01/22   [provider]      Allergies    Bee venom and Other    Review of Systems   Review of Systems  Constitutional:  Positive for fatigue. Negative for chills and fever.  Respiratory:  Negative for cough and shortness of breath.   Cardiovascular:  Negative for chest pain, palpitations and leg swelling.  Gastrointestinal:  Positive for constipation. Negative for abdominal pain, nausea and vomiting.  Endocrine: Positive for polydipsia and polyuria.  Genitourinary:  Positive for frequency. Negative for dysuria.  All other systems reviewed and are negative.   Physical Exam Updated Vital Signs BP 139/89   Pulse 97   Temp 97.6 F (36.4 C) (Oral)   Resp 18   Ht _0  (1.727 m)   Wt 81.6 kg   SpO2 92%   BMI 27.37 kg/m  Physical Exam Vitals and nursing note reviewed.  Constitutional:      General: He is not in acute distress.    Appearance: Normal appearance. He is not ill-appearing.  HENT:     Head: Normocephalic and atraumatic.     Nose: Nose normal.  Mouth/Throat:     Mouth: Mucous membranes are moist.  Eyes:     General: No scleral icterus.    Extraocular Movements: Extraocular movements intact.     Conjunctiva/sclera: Conjunctivae normal.  Cardiovascular:     Rate and Rhythm: Normal rate and regular rhythm.     Pulses: Normal pulses.  Pulmonary:     Effort: Pulmonary effort is normal. No respiratory distress.     Breath sounds: Normal breath sounds. No wheezing or rales.  Abdominal:     General: There is no distension.     Palpations: Abdomen is soft.     Tenderness: There is no abdominal tenderness. There is no guarding.   Musculoskeletal:        General: Normal range of motion.     Cervical back: Normal range of motion.     Right lower leg: No edema.     Left lower leg: No edema.  Skin:    General: Skin is warm and dry.  Neurological:     General: No focal deficit present.     Mental Status: He is alert. Mental status is at baseline.     ED Results / Procedures / Treatments   Labs (all labs ordered are listed, but only abnormal results are displayed) Labs Reviewed  CBC - Abnormal; Notable for the following components:      Result Value   WBC 12.6 (*)    RBC 6.28 (*)    Hemoglobin 19.1 (*)    HCT 55.2 (*)    All other components within normal limits  COMPREHENSIVE METABOLIC PANEL - Abnormal; Notable for the following components:   Sodium 132 (*)    Chloride 95 (*)    Glucose, Bld 754 (*)    BUN 28 (*)    ALT 67 (*)    Alkaline Phosphatase 146 (*)    All other components within normal limits  CBG MONITORING, ED - Abnormal; Notable for the following components:   Glucose-Capillary >600 (*)    All other components within normal limits  URINALYSIS, ROUTINE W REFLEX MICROSCOPIC  BLOOD GAS, VENOUS  LACTIC ACID, PLASMA  LACTIC ACID, PLASMA  LIPASE, BLOOD  CBG MONITORING, ED    EKG None  Radiology No results found.  Procedures .Critical Care  Performed by: Evlyn Courier, PA-C Authorized by: Evlyn Courier, PA-C   Critical care provider statement:    Critical care time (minutes):  40   Critical care was necessary to treat or prevent imminent or life-threatening deterioration of the following conditions:  Metabolic crisis   Critical care was time spent personally by me on the following activities:  Development of treatment plan with patient or surrogate, discussions with consultants, evaluation of patient's response to treatment, examination of patient, ordering and review of laboratory studies, ordering and review of radiographic studies, ordering and performing treatments and interventions,  pulse oximetry, re-evaluation of patient's condition and review of old charts     Medications Ordered in ED Medications  dextrose 50 % solution 0-50 mL (has no administration in time range)  insulin aspart (novoLOG) injection 15 Units (has no administration in time range)  sodium chloride 0.9 % bolus 1,000 mL (0 mLs Intravenous Stopped 03/02/22 1511)  lactated ringers bolus 1,000 mL (1,000 mLs Intravenous New Bag/Given 03/02/22 1511)    ED Course/ Medical Decision Making/ A&P Clinical Course as of 03/02/22 2244  Thu Mar 02, 2022  1600 Glucose of 754 on CMP, normal bicarb, normal anion gap.  CBC with leukocytosis  at 12.6.  Downtrending from yesterday.  Mild elevations in LFTs including alk phos.  Alk phos of 146, ALT of 67.  Without abdominal tenderness.  Low suspicion fo biliary disease.  Will add lipase to the workup.  Lipase within normal limits at 48.  Will give patient 2 fluid boluses, 15 units of insulin and reevaluate. [AA]  1820 Lactic acid of 2.8 on repeat.  Blood sugar improved to 310.  Will give 2 additional fluid boluses and repeat lactic.  If still elevated will admit for additional hydration and further management.  Plan discussed with patient and family at bedside and they are in agreement. [AA]    Clinical Course User Index [AA] Evlyn Courier, PA-C                           Medical Decision Making Amount and/or Complexity of Data Reviewed Labs: ordered.  Risk Prescription drug management.   Medical Decision Making / ED Course   This patient presents to the ED for concern of elevated blood sugar, this involves an extensive number of treatment options, and is a complaint that carries with it a high risk of complications and morbidity.  The differential diagnosis includes HHS, DKA, elevated blood sugar without HHS or DKA  MDM: 61 year old male with past medical history as noted above presents today for evaluation of elevated blood sugar that was noted on his preprocedure  labs.  He is overall well-appearing.  Blood work overall reassuring with the exception of his blood sugar which was 753.  Without anion gap, or lobar carb.  Initial lactic acid was elevated at 2.3, then increase to 2.8.  Once fluids were administered it resolved.  Blood sugar prior to discharge improved to around 300.  Patient has a glucose monitor and test strips which his sister just bought for head from pharmacy.  I did sit with patient and demonstrate the usage of this prior to his discharge.  Given we do not have a diabetic nurse educator at this time we will not prescribe insulin.  He will follow-up with his PCP clinic tomorrow regarding a visit for reevaluation and potential for starting on insulin.  He will touch base with his gastroenterologist regarding his upcoming procedure and if that needs to be rescheduled.  He felt improved prior to discharge.  He had mild leukocytosis yesterday which improved today.  Heart rate improved after volume resuscitation.  Patient is on glipizide, Actos, metformin.  He will continue taking these as prescribed.  Patient is appropriate for discharge.  Discharged in stable condition.  Admission considered however lactic acidosis resolved and without evidence of anion gap or low bicarb.  Blood sugars improved.  He was able to tolerate p.o. intake prior to discharge. Case discussed with attending Dr. Roderic Palau.  Patient was critically ill however thankfully he was without signs of DKA, no anion gap, and without low bicarb.  He did have lactic acidosis which resolved with fluids. Admission considered however given improvement in his blood sugars, and without anion gap and normal bicarb feel he was appropriate for discharge with strict return precautions.  Lab Tests: -I ordered, reviewed, and interpreted labs.   The pertinent results include:   Labs Reviewed  CBC - Abnormal; Notable for the following components:      Result Value   WBC 12.6 (*)    RBC 6.28 (*)     Hemoglobin 19.1 (*)    HCT 55.2 (*)  All other components within normal limits  URINALYSIS, ROUTINE W REFLEX MICROSCOPIC - Abnormal; Notable for the following components:   Color, Urine STRAW (*)    Glucose, UA >=500 (*)    Ketones, ur 5 (*)    All other components within normal limits  COMPREHENSIVE METABOLIC PANEL - Abnormal; Notable for the following components:   Sodium 132 (*)    Chloride 95 (*)    Glucose, Bld 754 (*)    BUN 28 (*)    ALT 67 (*)    Alkaline Phosphatase 146 (*)    All other components within normal limits  BLOOD GAS, VENOUS - Abnormal; Notable for the following components:   pO2, Ven <31 (*)    Bicarbonate 34.2 (*)    Acid-Base Excess 7.2 (*)    All other components within normal limits  LACTIC ACID, PLASMA - Abnormal; Notable for the following components:   Lactic Acid, Venous 2.8 (*)    All other components within normal limits  LACTIC ACID, PLASMA - Abnormal; Notable for the following components:   Lactic Acid, Venous 2.3 (*)    All other components within normal limits  CBG MONITORING, ED - Abnormal; Notable for the following components:   Glucose-Capillary >600 (*)    All other components within normal limits  CBG MONITORING, ED - Abnormal; Notable for the following components:   Glucose-Capillary >600 (*)    All other components within normal limits  CBG MONITORING, ED - Abnormal; Notable for the following components:   Glucose-Capillary 510 (*)    All other components within normal limits  CBG MONITORING, ED - Abnormal; Notable for the following components:   Glucose-Capillary 310 (*)    All other components within normal limits  LIPASE, BLOOD  LACTIC ACID, PLASMA  CBG MONITORING, ED      EKG  EKG Interpretation  Date/Time:    Ventricular Rate:    PR Interval:    QRS Duration:   QT Interval:    QTC Calculation:   R Axis:     Text Interpretation:          Medicines ordered and prescription drug management: Meds ordered this  encounter  Medications   sodium chloride 0.9 % bolus 1,000 mL   dextrose 50 % solution 0-50 mL   lactated ringers bolus 1,000 mL   DISCONTD: insulin aspart (novoLOG) injection 15 Units   insulin aspart (novoLOG) injection 15 Units   lactated ringers bolus 2,000 mL    -I have reviewed the patients home medicines and have made adjustments as needed  Critical interventions Large-volume resuscitation.  Insulin.    Reevaluation: After the interventions noted above, I reevaluated the patient and found that they have :improved  Co morbidities that complicate the patient evaluation  Past Medical History:  Diagnosis Date   Diabetes mellitus type 2 in nonobese [facility])    History of kidney stones    HLD (hyperlipidemia)    Hypertension    Polycythemia    Smoking       Dispostion: Patient is appropriate for discharge.  Discharged in stable condition.  Return precaution discussed.  Patient voices understanding and is in agreement with plan.      Final Clinical Impression(s) / ED Diagnoses Final diagnoses:  Hyperglycemia     _0 @         Final Clinical Impression(s) / ED Diagnoses Final diagnoses:  None    Rx / DC Orders ED Discharge Orders     None  Evlyn Courier, PA-C 03/02/22 2253    Milton Ferguson, MD 03/04/22 1005

## 2022-03-03 ENCOUNTER — Telehealth: Payer: Self-pay | Admitting: *Deleted

## 2022-03-03 LAB — CBG MONITORING, ED: Glucose-Capillary: 307 mg/dL — ABNORMAL HIGH (ref 70–99)

## 2022-03-03 NOTE — Telephone Encounter (Signed)
Called pt and made aware of recs below. He will call us back with an update

## 2022-03-03 NOTE — Telephone Encounter (Signed)
Pt called in. He did go to the ER yesterday. He has follow up with PCP on Monday. He wants to know when he should reschedule procedures. Please advise thanks!

## 2022-03-06 ENCOUNTER — Ambulatory Visit (HOSPITAL_COMMUNITY): Admission: RE | Admit: 2022-03-06 | Payer: BC Managed Care – PPO | Source: Home / Self Care

## 2022-03-06 ENCOUNTER — Ambulatory Visit: Payer: BC Managed Care – PPO | Admitting: Family Medicine

## 2022-03-06 ENCOUNTER — Encounter (HOSPITAL_COMMUNITY): Admission: RE | Payer: Self-pay | Source: Home / Self Care

## 2022-03-06 ENCOUNTER — Telehealth: Payer: Self-pay

## 2022-03-06 ENCOUNTER — Other Ambulatory Visit: Payer: Self-pay | Admitting: Family Medicine

## 2022-03-06 VITALS — BP 126/72 | HR 110 | Ht 68.0 in | Wt 182.0 lb

## 2022-03-06 DIAGNOSIS — E119 Type 2 diabetes mellitus without complications: Secondary | ICD-10-CM

## 2022-03-06 SURGERY — COLONOSCOPY WITH PROPOFOL
Anesthesia: Monitor Anesthesia Care

## 2022-03-06 MED ORDER — INSULIN GLARGINE-YFGN 100 UNIT/ML ~~LOC~~ SOPN: 20.0000 [IU] | PEN_INJECTOR | Freq: Every day | SUBCUTANEOUS | 1 refills | Status: DC

## 2022-03-06 MED ORDER — LANTUS SOLOSTAR 100 UNIT/ML ~~LOC~~ SOPN: 20.0000 [IU] | PEN_INJECTOR | Freq: Every day | SUBCUTANEOUS | 99 refills | Status: DC

## 2022-03-06 NOTE — Progress Notes (Signed)
Subjective:    Patient ID: Collin Farmer, male    DOB: [birth year], 61 y.o.   MRN: [id number] Patient recently [place].  His blood sugar was almost 700.  He then came to a local urgent care where his blood sugar was still extremely elevated.  He has been consistently taking his metformin, glipizide, pioglitazone, and [place].  He has 1 week of sugar values for me to review.  Sugars are between 230 and over 400.  Average sugar is around 300.  The dysphagia has improved with hydration.  He has an appointment to see GI for an EGD but they are waiting for me to get sugar control first Past Medical History:  Diagnosis Date   Diabetes mellitus type 2 in nonobese [facility])    History of kidney stones    HLD (hyperlipidemia)    Hypertension    Polycythemia    Smoking    Current Outpatient Medications on File Prior to Visit  Medication Sig Dispense Refill   aspirin 81 MG tablet Take 81 mg by mouth daily.     atorvastatin (LIPITOR) 20 MG tablet TAKE 1 TABLET BY MOUTH DAILY. PHYSICAL APPOINTMENT REQUIRED WITH PRESCRIBER FOR REFILLS 90 tablet 3   FARXIGA 10 MG TABS tablet TAKE 1 TABLET BY MOUTH DAILY 90 tablet 1   glipiZIDE (GLUCOTROL XL) 5 MG 24 hr tablet TAKE 1 TABLET(5 MG) BY MOUTH DAILY WITH BREAKFAST (Patient taking differently: Take 5 mg by mouth every evening.) 90 tablet 0   lisinopril-hydrochlorothiazide (ZESTORETIC) 20-12.5 MG tablet TAKE 1 TABLET BY MOUTH DAILY 90 tablet 0   metFORMIN (GLUCOPHAGE) 500 MG tablet TAKE 2 TABLETS(1000 MG) BY MOUTH TWICE DAILY WITH A MEAL (Patient taking differently: Take 1,000 mg by mouth 2 (two) times daily with a meal.) 120 tablet 5   pioglitazone (ACTOS) 30 MG tablet Take 1 tablet (30 mg total) by mouth daily. 90 tablet 3   No current facility-administered medications on file prior to visit.   Allergies  Allergen Reactions   Bee Venom Anaphylaxis   Other     Bee stings   Social History   Socioeconomic  History   Marital status: Single    Spouse name: Not on file   Number of children: Not on file   Years of education: Not on file   Highest education level: Not on file  Occupational History   Not on file  Tobacco Use   Smoking status: Every Day    Packs/day: 0.50    Years: 37.00    Total pack years: 18.50    Types: Cigarettes   Smokeless tobacco: Never  Substance and Sexual Activity   Alcohol use: No    Alcohol/week: 0.0 standard drinks of alcohol   Drug use: No   Sexual activity: Never    Birth control/protection: Abstinence  Other Topics Concern   Not on file  Social History Narrative   Not on file   Social Determinants of Health   Financial Resource Strain: Not on file  Food Insecurity: Not on file  Transportation Needs: Not on file  Physical Activity: Not on file  Stress: Not on file  Social Connections: Not on file  Intimate Partner Violence: Not on file      Review of Systems  All other systems reviewed and are negative.      Objective:   Physical Exam Vitals reviewed.  Constitutional:      General: He is  not in acute distress.    Appearance: He is well-developed. He is not diaphoretic.  HENT:     Head: Normocephalic and atraumatic.  Eyes:     General: No scleral icterus.    Conjunctiva/sclera: Conjunctivae normal.  Neck:     Thyroid: No thyromegaly.     Vascular: No JVD.  Cardiovascular:     Rate and Rhythm: Normal rate and regular rhythm.     Heart sounds: Normal heart sounds. No murmur heard.    No friction rub. No gallop.  Pulmonary:     Effort: Pulmonary effort is normal. No respiratory distress.     Breath sounds: Normal breath sounds. No wheezing or rales.  Chest:     Chest wall: No tenderness.  Abdominal:     General: Bowel sounds are normal. There is no distension.     Palpations: Abdomen is soft. There is no mass.     Tenderness: There is no abdominal tenderness. There is no guarding or rebound.  Musculoskeletal:     Cervical  back: Neck supple.  Lymphadenopathy:     Cervical: No cervical adenopathy.           Assessment & Plan:    Diabetes mellitus type 2 in nonobese (HCC) - Plan: Hemoglobin A1c, CBC with Differential/Platelet, COMPLETE METABOLIC PANEL WITH GFR  Spent more than 30 minutes today with the patient discussing diabetes.  Begin glargine 20 units subcu daily.  Increase by 1 unit until fasting blood sugars are less than 130.  He will check fasting blood sugars and 2-hour postprandial sugars and we will discuss the values in 1 week and follow-up.  We also showed him how to administer the shots.  Check CBC CMP and A1c today.  Recommended 45 g of carbs with meals and 15 g of carbs with snacks.  Total time with the patient was more than 30 minutes Believe the patient has subacromial bursitis.  Using sterile technique, I injected the right subacromial space with 2 cc lidocaine, 2 cc of Marcaine, and 2 cc of 40 mg/mL Kenalog.  Recheck in 1 week if no better or sooner if worsening.  While the patient is here today I will repeat an A1c.  His A1c was 7.6 prior to starting glipizide.  Has been on the medication now for 4 months.

## 2022-03-06 NOTE — Telephone Encounter (Signed)
[facility] has Semgelee and it is covered by the [employer].

## 2022-03-06 NOTE — Telephone Encounter (Signed)
Pt called and [facility] is out of the Lantus Solostar and patient's insurance will not cover it. [facility] does have Semaglutide and his insurance will cover it. Can we send in a new rx for him? Thank you!

## 2022-03-07 ENCOUNTER — Telehealth: Payer: Self-pay

## 2022-03-07 ENCOUNTER — Other Ambulatory Visit: Payer: Self-pay

## 2022-03-07 DIAGNOSIS — E119 Type 2 diabetes mellitus without complications: Secondary | ICD-10-CM

## 2022-03-07 LAB — CBC WITH DIFFERENTIAL/PLATELET
Absolute Monocytes: 992 cells/uL — ABNORMAL HIGH (ref 200–950)
Basophils Absolute: 87 cells/uL (ref 0–200)
Basophils Relative: 0.7 %
Eosinophils Absolute: 37 cells/uL (ref 15–500)
Eosinophils Relative: 0.3 %
HCT: 56.1 % — ABNORMAL HIGH (ref 38.5–50.0)
Hemoglobin: 19.4 g/dL — ABNORMAL HIGH (ref 13.2–17.1)
Lymphs Abs: 2666 cells/uL (ref 850–3900)
MCH: 30.4 pg (ref 27.0–33.0)
MCHC: 34.6 g/dL (ref 32.0–36.0)
MCV: 87.8 fL (ref 80.0–100.0)
MPV: 12 fL (ref 7.5–12.5)
Monocytes Relative: 8 %
Neutro Abs: 8618 cells/uL — ABNORMAL HIGH (ref 1500–7800)
Neutrophils Relative %: 69.5 %
Platelets: 215 10*3/uL (ref 140–400)
RBC: 6.39 10*6/uL — ABNORMAL HIGH (ref 4.20–5.80)
RDW: 12.9 % (ref 11.0–15.0)
Total Lymphocyte: 21.5 %
WBC: 12.4 10*3/uL — ABNORMAL HIGH (ref 3.8–10.8)

## 2022-03-07 LAB — COMPLETE METABOLIC PANEL WITH GFR
AG Ratio: 1.7 (calc) (ref 1.0–2.5)
ALT: 69 U/L — ABNORMAL HIGH (ref 9–46)
AST: 53 U/L — ABNORMAL HIGH (ref 10–35)
Albumin: 4.3 g/dL (ref 3.6–5.1)
Alkaline phosphatase (APISO): 105 U/L (ref 35–144)
BUN: 15 mg/dL (ref 7–25)
CO2: 25 mmol/L (ref 20–32)
Calcium: 10 mg/dL (ref 8.6–10.3)
Chloride: 99 mmol/L (ref 98–110)
Creat: 0.87 mg/dL (ref 0.70–1.35)
Globulin: 2.6 g/dL (calc) (ref 1.9–3.7)
Glucose, Bld: 193 mg/dL — ABNORMAL HIGH (ref 65–99)
Potassium: 4.3 mmol/L (ref 3.5–5.3)
Sodium: 137 mmol/L (ref 135–146)
Total Bilirubin: 0.6 mg/dL (ref 0.2–1.2)
Total Protein: 6.9 g/dL (ref 6.1–8.1)
eGFR: 97 mL/min/{1.73_m2} (ref >=60)

## 2022-03-07 LAB — HEMOGLOBIN A1C: Hgb A1c MFr Bld: >14 % of total Hgb — ABNORMAL HIGH (ref <5.7)

## 2022-03-07 MED ORDER — BD PEN NEEDLE MICRO U/F 32G X 6 MM MISC: 1 refills | Status: AC

## 2022-03-07 NOTE — Telephone Encounter (Signed)
Pt called in stating that he picked up his meds for his diabetes (insulin), but he doesn't have a prescription for syringes. Please advise  Cb#: [phone]

## 2022-03-13 ENCOUNTER — Ambulatory Visit: Payer: BC Managed Care – PPO | Admitting: Family Medicine

## 2022-03-13 ENCOUNTER — Encounter: Payer: Self-pay | Admitting: Family Medicine

## 2022-03-13 VITALS — BP 128/74 | HR 76 | Ht 68.0 in | Wt 191.4 lb

## 2022-03-13 DIAGNOSIS — E119 Type 2 diabetes mellitus without complications: Secondary | ICD-10-CM

## 2022-03-13 DIAGNOSIS — D751 Secondary polycythemia: Secondary | ICD-10-CM

## 2022-03-13 DIAGNOSIS — R7989 Other specified abnormal findings of blood chemistry: Secondary | ICD-10-CM | POA: Diagnosis not present

## 2022-03-13 MED ORDER — DEXCOM G7 RECEIVER DEVI: 1.0000 | Freq: Once | 1 refills | Status: AC

## 2022-03-13 MED ORDER — DEXCOM G7 SENSOR MISC: 1.0000 | 11 refills | Status: DC

## 2022-03-13 NOTE — Progress Notes (Signed)
Subjective:    Patient ID: Collin Farmer, male    DOB: 04-02-1961, 61 y.o.   MRN: [id number] 03/06/22 Patient recently went [place] an [place].  His blood sugar was almost 700.  He then came to a local urgent care where his blood sugar was still extremely elevated.  He has been consistently taking his metformin, glipizide, pioglitazone, and [place].  He has 1 week of sugar values for me to review.  Sugars are between 230 and over 400.  Average sugar is around 300.  The dysphagia has improved with hydration.  He has an appointment to see GI for an EGD but they are waiting for me to get sugar control first.  At that time, my plan was:  Spent more than 30 minutes today with the patient discussing diabetes.  Begin glargine 20 units subcu daily.  Increase by 1 unit until fasting blood sugars are less than 130.  He will check fasting blood sugars and 2-hour postprandial sugars and we will discuss the values in 1 week and follow-up.  We also showed him how to administer the shots.  Check CBC CMP and A1c today.  Recommended 45 g of carbs with meals and 15 g of carbs with snacks.  Total time with the patient was more than 30 minutes Believe the patient has subacromial bursitis.  Using sterile technique, I injected the right subacromial space with 2 cc lidocaine, 2 cc of Marcaine, and 2 cc of 40 mg/mL Kenalog.  Recheck in 1 week if no better or sooner if worsening.  While the patient is here today I will repeat an A1c.  His A1c was 7.6 prior to starting glipizide.  Has been on the medication now for 4 months.  03/13/22 No visits with results within 1 Week(s) from this visit.  Latest known visit with results is:  Office Visit on 03/06/2022  Component Date Value Ref Range Status   Hgb A1c MFr Bld 03/06/2022 >14.0 (H)  <5.7 % of total Hgb Final   Comment: Verified by repeat analysis. . For someone without known diabetes, a hemoglobin A1c value of 6.5% or greater indicates that  they may have  diabetes and this should be confirmed with a follow-up  test. . For someone with known diabetes, a value <7% indicates  that their diabetes is well controlled and a value  greater than or equal to 7% indicates suboptimal  control. A1c targets should be individualized based on  duration of diabetes, age, comorbid conditions, and  other considerations. . Currently, no consensus exists regarding use of hemoglobin A1c for diagnosis of diabetes for children. .    Mean Plasma Glucose 03/06/2022   mg/dL Final   Comment: eAG cannot be calculated. Hemoglobin A1c result exceeds the linearity of the assay.    WBC 03/06/2022 12.4 (H)  3.8 - 10.8 Thousand/uL Final   RBC 03/06/2022 6.39 (H)  4.20 - 5.80 Million/uL Final   Hemoglobin 03/06/2022 19.4 (H)  13.2 - 17.1 g/dL Final   Comment: Verified by repeat analysis. Marland Kitchen    HCT 03/06/2022 56.1 (H)  38.5 - 50.0 % Final   MCV 03/06/2022 87.8  80.0 - 100.0 fL Final   MCH 03/06/2022 30.4  27.0 - 33.0 pg Final   MCHC 03/06/2022 34.6  32.0 - 36.0 g/dL Final   RDW 03/06/2022 12.9  11.0 - 15.0 % Final   Platelets 03/06/2022 215  140 - 400 Thousand/uL Final  MPV 03/06/2022 12.0  7.5 - 12.5 fL Final   Neutro Abs 03/06/2022 8,618 (H)  1,500 - 7,800 cells/uL Final   Lymphs Abs 03/06/2022 2,666  850 - 3,900 cells/uL Final   Absolute Monocytes 03/06/2022 992 (H)  200 - 950 cells/uL Final   Eosinophils Absolute 03/06/2022 37  15 - 500 cells/uL Final   Basophils Absolute 03/06/2022 87  0 - 200 cells/uL Final   Neutrophils Relative % 03/06/2022 69.5  % Final   Total Lymphocyte 03/06/2022 21.5  % Final   Monocytes Relative 03/06/2022 8.0  % Final   Eosinophils Relative 03/06/2022 0.3  % Final   Basophils Relative 03/06/2022 0.7  % Final   Glucose, Bld 03/06/2022 193 (H)  65 - 99 mg/dL Final   Comment: .            Fasting reference interval . For someone without known diabetes, a glucose value >125 mg/dL indicates that they may  have diabetes and this should be confirmed with a follow-up test. .    BUN 03/06/2022 15  7 - 25 mg/dL Final   Creat 03/06/2022 0.87  0.70 - 1.35 mg/dL Final   eGFR 03/06/2022 97  > OR = 60 mL/min/1.53m Final   BUN/Creatinine Ratio 03/06/2022 SEE NOTE:  6 - 22 (calc) Final   Comment:    Not Reported: BUN and Creatinine are within    reference range. .    Sodium 03/06/2022 137  135 - 146 mmol/L Final   Potassium 03/06/2022 4.3  3.5 - 5.3 mmol/L Final   Chloride 03/06/2022 99  98 - 110 mmol/L Final   CO2 03/06/2022 25  20 - 32 mmol/L Final   Calcium 03/06/2022 10.0  8.6 - 10.3 mg/dL Final   Total Protein 03/06/2022 6.9  6.1 - 8.1 g/dL Final   Albumin 03/06/2022 4.3  3.6 - 5.1 g/dL Final   Globulin 03/06/2022 2.6  1.9 - 3.7 g/dL (calc) Final   AG Ratio 03/06/2022 1.7  1.0 - 2.5 (calc) Final   Total Bilirubin 03/06/2022 0.6  0.2 - 1.2 mg/dL Final   Alkaline phosphatase (APISO) 03/06/2022 105  35 - 144 U/L Final   AST 03/06/2022 53 (H)  10 - 35 U/L Final   ALT 03/06/2022 69 (H)  9 - 46 U/L Final   Patient is here today for follow-up.  He is currently up to 24 units of glargine.  Fasting blood sugars are averaging between 150 and 200.  He is not checking 2-hour postprandial sugars but he feels much better.  The dysphagia has resolved.  Please see the blood work above.  He has significant polycythemia.  He is a longtime smoker believe this is most likely secondary.  He was likely made worse by dehydration.  However he also has elevated liver function test.  He is a diabetic.  And his ferritin was recently greater than 500.  All this leads me to be concerned about hemochromatosis.  Past Medical History:  Diagnosis Date   Diabetes mellitus type 2 in nonobese ([facility]    History of kidney stones    HLD (hyperlipidemia)    Hypertension    Polycythemia    Smoking    Current Outpatient Medications on File Prior to Visit  Medication Sig Dispense Refill   aspirin 81 MG tablet Take 81 mg by  mouth daily.     atorvastatin (LIPITOR) 20 MG tablet TAKE 1 TABLET BY MOUTH DAILY. PHYSICAL APPOINTMENT REQUIRED WITH PRESCRIBER FOR REFILLS 90 tablet 3  FARXIGA 10 MG TABS tablet TAKE 1 TABLET BY MOUTH DAILY 90 tablet 1   glipiZIDE (GLUCOTROL XL) 5 MG 24 hr tablet TAKE 1 TABLET(5 MG) BY MOUTH DAILY WITH BREAKFAST (Patient taking differently: Take 5 mg by mouth every evening.) 90 tablet 0   insulin glargine (LANTUS SOLOSTAR) 100 UNIT/ML Solostar Pen Inject 20 Units into the skin daily. 15 mL PRN   insulin glargine-yfgn (SEMGLEE, YFGN,) 100 UNIT/ML Pen Inject 20 Units into the skin daily. 15 mL 1   Insulin Pen Needle (BD PEN NEEDLE MICRO U/F) 32G X 6 MM MISC Pt to use with Semglee pen to administer insulin daily. 30 each 1   lisinopril-hydrochlorothiazide (ZESTORETIC) 20-12.5 MG tablet TAKE 1 TABLET BY MOUTH DAILY 90 tablet 0   metFORMIN (GLUCOPHAGE) 500 MG tablet TAKE 2 TABLETS(1000 MG) BY MOUTH TWICE DAILY WITH A MEAL (Patient taking differently: Take 1,000 mg by mouth 2 (two) times daily with a meal.) 120 tablet 5   pioglitazone (ACTOS) 30 MG tablet Take 1 tablet (30 mg total) by mouth daily. 90 tablet 3   No current facility-administered medications on file prior to visit.   Allergies  Allergen Reactions   Bee Venom Anaphylaxis   Other     Bee stings   Social History   Socioeconomic History   Marital status: Single    Spouse name: Not on file   Number of children: Not on file   Years of education: Not on file   Highest education level: Not on file  Occupational History   Not on file  Tobacco Use   Smoking status: Every Day    Packs/day: 0.50    Years: 37.00    Total pack years: 18.50    Types: Cigarettes   Smokeless tobacco: Never  Substance and Sexual Activity   Alcohol use: No    Alcohol/week: 0.0 standard drinks of alcohol   Drug use: No   Sexual activity: Never    Birth control/protection: Abstinence  Other Topics Concern   Not on file  Social History Narrative    Not on file   Social Determinants of Health   Financial Resource Strain: Not on file  Food Insecurity: Not on file  Transportation Needs: Not on file  Physical Activity: Not on file  Stress: Not on file  Social Connections: Not on file  Intimate Partner Violence: Not on file      Review of Systems  All other systems reviewed and are negative.      Objective:   Physical Exam Vitals reviewed.  Constitutional:      General: He is not in acute distress.    Appearance: He is well-developed. He is not diaphoretic.  HENT:     Head: Normocephalic and atraumatic.  Eyes:     General: No scleral icterus.    Conjunctiva/sclera: Conjunctivae normal.  Neck:     Thyroid: No thyromegaly.     Vascular: No JVD.  Cardiovascular:     Rate and Rhythm: Normal rate and regular rhythm.     Heart sounds: Normal heart sounds. No murmur heard.    No friction rub. No gallop.  Pulmonary:     Effort: Pulmonary effort is normal. No respiratory distress.     Breath sounds: Normal breath sounds. No wheezing or rales.  Chest:     Chest wall: No tenderness.  Abdominal:     General: Bowel sounds are normal. There is no distension.     Palpations: Abdomen is soft. There is no  mass.     Tenderness: There is no abdominal tenderness. There is no guarding or rebound.  Musculoskeletal:     Cervical back: Neck supple.  Lymphadenopathy:     Cervical: No cervical adenopathy.           Assessment & Plan:   Diabetes mellitus type 2 in nonobese (HCC)  Polycythemia  Elevated ferritin  Elevated LFTs Sugars are not yet at goal however they are much better than anticipated.  Increase glargine to 30 units a day and continue to uptitrate until fasting blood sugars are less than 130.  I also asked him to check his 2-hour postprandial sugars to determine how well-controlled his sugars are and whether we need to add mealtime insulin.  Recheck via email in 1 week and continue uptitrate glargine as  necessary.  Patient has significant polycythemia.  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  However I will consult hematology given the level of the hemoglobin to determine if he needs genetic testing for polycythemia vera.  He also had elevated ferritin levels, and elevated percent saturation, elevated liver function test, and obviously diabetes.  He denies any family history of liver problems however I am concerned about possible hemochromatosis.  Therefore I question whether his lab results are due to dehydration and hemoconcentration or whether we should perform genetic testing to evaluate for hemochromatosis.  I would appreciate hematology's recommendations on this as well.

## 2022-03-22 NOTE — Telephone Encounter (Signed)
Spoke to pt, he informed me that his swallowing is much better. He states he has a monitoring system that tells him what his blood sugars are. He states they have been good no higher than 170.

## 2022-03-23 NOTE — Telephone Encounter (Signed)
Spoke to pt, he informed me that he would like to wait for the EGD. He states he is not having any problems at this time. Informed to call office if he starts to have problems.

## 2022-03-24 ENCOUNTER — Other Ambulatory Visit: Payer: Self-pay

## 2022-03-24 ENCOUNTER — Telehealth: Payer: Self-pay

## 2022-03-24 DIAGNOSIS — E119 Type 2 diabetes mellitus without complications: Secondary | ICD-10-CM

## 2022-03-24 MED ORDER — INSULIN GLARGINE-YFGN 100 UNIT/ML ~~LOC~~ SOPN: 20.0000 [IU] | PEN_INJECTOR | Freq: Every day | SUBCUTANEOUS | 0 refills | Status: DC

## 2022-03-24 NOTE — Telephone Encounter (Signed)
Pt called in stating that the injection med is working well for him and would like to know if he could get a refill of this med please. Please advise.  Cb#: [phone]

## 2022-03-28 ENCOUNTER — Other Ambulatory Visit: Payer: Self-pay

## 2022-03-28 ENCOUNTER — Telehealth: Payer: Self-pay

## 2022-03-28 DIAGNOSIS — E119 Type 2 diabetes mellitus without complications: Secondary | ICD-10-CM

## 2022-03-28 MED ORDER — INSULIN DETEMIR 100 UNIT/ML FLEXPEN: 34.0000 [IU] | PEN_INJECTOR | Freq: Every day | SUBCUTANEOUS | 11 refills | Status: DC

## 2022-03-28 MED ORDER — INSULIN DETEMIR 100 UNIT/ML ~~LOC~~ SOLN: 34.0000 [IU] | Freq: Every day | SUBCUTANEOUS | 11 refills | Status: DC

## 2022-03-28 NOTE — Telephone Encounter (Signed)
Pt called and states pharmacy advised that the [facility] is on backorder and will be out of stock for over a month. Pt states pharmacist advised that Levemir can be substituted. RX sent in for patient. Thank you. MJP,LPN

## 2022-04-04 ENCOUNTER — Telehealth: Payer: Self-pay

## 2022-04-04 NOTE — Telephone Encounter (Signed)
Pt would like to get a note to return back to work this week, if you are okay with it? Pt advised you are out of office until Thursday. Pt verbalized understanding of all.

## 2022-04-06 ENCOUNTER — Other Ambulatory Visit: Payer: Self-pay

## 2022-04-06 ENCOUNTER — Telehealth: Payer: Self-pay

## 2022-04-06 DIAGNOSIS — E119 Type 2 diabetes mellitus without complications: Secondary | ICD-10-CM

## 2022-04-06 MED ORDER — DAPAGLIFLOZIN PROPANEDIOL 10 MG PO TABS: 10.0000 mg | ORAL_TABLET | Freq: Every day | ORAL | 3 refills | Status: DC

## 2022-04-06 MED ORDER — DEXCOM G7 SENSOR MISC: 1.0000 | 11 refills | Status: AC

## 2022-04-06 NOTE — Telephone Encounter (Signed)
Pt called in inquiring about the status of his refill of this med FARXIGA 10 MG TABS tablet [[id number]]. Pt states that he is down to his last 2 pills of this med. Pt also would like to get a refill of   Continuous Blood Gluc Receiver ([place]) [place] [[id number]]  Please call pt back at [phone]

## 2022-04-07 ENCOUNTER — Encounter: Payer: Self-pay | Admitting: Hematology

## 2022-04-07 ENCOUNTER — Inpatient Hospital Stay: Payer: BC Managed Care – PPO | Attending: Hematology | Admitting: Hematology

## 2022-04-07 ENCOUNTER — Other Ambulatory Visit: Payer: Self-pay

## 2022-04-07 VITALS — BP 157/96 | HR 79 | Temp 98.4°F | Resp 18 | Ht 67.0 in | Wt 194.8 lb

## 2022-04-07 DIAGNOSIS — Z87891 Personal history of nicotine dependence: Secondary | ICD-10-CM

## 2022-04-07 DIAGNOSIS — I10 Essential (primary) hypertension: Secondary | ICD-10-CM | POA: Diagnosis not present

## 2022-04-07 DIAGNOSIS — F17210 Nicotine dependence, cigarettes, uncomplicated: Secondary | ICD-10-CM | POA: Diagnosis not present

## 2022-04-07 DIAGNOSIS — E119 Type 2 diabetes mellitus without complications: Secondary | ICD-10-CM

## 2022-04-07 DIAGNOSIS — D751 Secondary polycythemia: Secondary | ICD-10-CM

## 2022-04-07 DIAGNOSIS — Z122 Encounter for screening for malignant neoplasm of respiratory organs: Secondary | ICD-10-CM

## 2022-04-07 DIAGNOSIS — G4730 Sleep apnea, unspecified: Secondary | ICD-10-CM

## 2022-04-07 NOTE — Patient Instructions (Addendum)
Collin Farmer  Discharge Instructions  You were seen and examined today by Dr. Delton Coombes. Dr. Delton Coombes is a hematologist, meaning that he specializes in blood abnormalities. Dr. Delton Coombes discussed your past medical history, family history of cancers/blood conditions and the events that led to you being here today.  You were referred to Dr. Delton Coombes due to Polycythemia. This is an elevation of your red blood cells, hemoglobin and hematocrit. This can cause headaches and fatigue, but other times it does not cause any issues.  You previously saw Dr. Walden Field here at the [facility] for the same reason. She did an extensive work-up, which was all normal.  Polycythemia can be caused by medications or smoking. We will continue to monitor your labs and see you on a regular basis to ensure it is not causing you any issues.  Due to your smoking history, Dr. Delton Coombes has recommended Lung Cancer Screening CT scan. This is a preventative medicine scan that is typically done on a yearly basis for smokers. It is covered by insurance.  You can give blood and this will lessen the elevation. You can give blood approximately every 6 weeks.  Follow-up as scheduled.  Thank you for choosing [facility] to provide your oncology and hematology care.   To afford each patient quality time with our provider, please arrive at least 15 minutes before your scheduled appointment time. You may need to reschedule your appointment if you arrive late (10 or more minutes). Arriving late affects you and other patients whose appointments are after yours.  Also, if you miss three or more appointments without notifying the office, you may be dismissed from the clinic at the provider's discretion.    Again, thank you for choosing [facility].  Our hope is that these requests will decrease the amount of time that you wait before being seen by our physicians.    If you have a lab appointment with the [facility] please come in thru the Main Entrance and check in at the main information desk.           _____________________________________________________________  Should you have questions after your visit to [facility], please contact our office at [phone] and follow the prompts.  Our office hours are 8:00 a.m. to 4:30 p.m. Monday - Thursday and 8:00 a.m. to 2:30 p.m. Friday.  Please note that voicemails left after 4:00 p.m. may not be returned until the following business day.  We are closed weekends and all major holidays.  You do have access to a nurse 24-7, just call the main number to the clinic [phone] and do not press any options, hold on the line and a nurse will answer the phone.    For prescription refill requests, have your pharmacy contact our office and allow 72 hours.    Masks are optional in the cancer centers. If you would like for your care team to wear a mask while they are taking care of you, please let them know. You may have one support person who is at least 64 years old accompany you for your appointments.

## 2022-04-07 NOTE — Progress Notes (Signed)
CONSULT NOTE  Patient Care Team: Susy Frizzle, MD as PCP - General (Family Medicine) Derek Jack, MD as Medical Oncologist (Hematology)  CHIEF COMPLAINTS/PURPOSE OF CONSULTATION:  Erythrocytosis  HISTORY OF PRESENTING ILLNESS:  Collin Farmer 62 y.o. male is seen at the request of Dr. Dennard Schaumann for further workup and management of elevated hemoglobin and RBC mass.  Labs on 03/06/2022 showed white count 12.4, hemoglobin 19.4, hematocrit 56, RBC 6.39 with normal platelet count.  Differential showed elevated absolute neutrophil count.  Absolute monocyte count was also slightly elevated.  He had a sleep apnea testing done in 2019 which was negative.  He reports energy levels are 100%.  Denies any fatigue or headaches.  Denies any aquagenic pruritus.  No vasomotor symptoms.  No DVT, MI or CVA.  MEDICAL HISTORY:  Past Medical History:  Diagnosis Date   Diabetes mellitus type 2 in nonobese [facility])    History of kidney stones    HLD (hyperlipidemia)    Hypertension    Polycythemia    Smoking     SURGICAL HISTORY: History reviewed. No pertinent surgical history.  SOCIAL HISTORY: Social History   Socioeconomic History   Marital status: Single    Spouse name: Not on file   Number of children: Not on file   Years of education: Not on file   Highest education level: Not on file  Occupational History   Not on file  Tobacco Use   Smoking status: Every Day    Packs/day: 0.50    Years: 37.00    Total pack years: 18.50    Types: Cigarettes   Smokeless tobacco: Never  Substance and Sexual Activity   Alcohol use: No    Alcohol/week: 0.0 standard drinks of alcohol   Drug use: No   Sexual activity: Never    Birth control/protection: Abstinence  Other Topics Concern   Not on file  Social History Narrative   Not on file   Social Determinants of Health   Financial Resource Strain: Not on file  Food Insecurity: No Food Insecurity (04/07/2022)   Hunger Vital Sign    Worried  About Running Out of Food in the Last Year: Never true    Ran Out of Food in the Last Year: Never true  Transportation Needs: No Transportation Needs (04/07/2022)   PRAPARE - Hydrologist (Medical): No    Lack of Transportation (Non-Medical): No  Physical Activity: Not on file  Stress: Not on file  Social Connections: Not on file  Intimate Partner Violence: Not At Risk (04/07/2022)   Humiliation, Afraid, Rape, and Kick questionnaire    Fear of Current or Ex-Partner: No    Emotionally Abused: No    Physically Abused: No    Sexually Abused: No    FAMILY HISTORY: Family History  Problem Relation Age of Onset   GI Bleed Mother    CAD Mother    Heart attack Father    COPD Father    Prostate cancer Father        possibly   Diabetes Sister    Heart attack Paternal Grandmother    Diabetes Paternal Grandfather    Colon cancer Neg Hx    Colon polyps Neg Hx     ALLERGIES:  is allergic to bee venom and other.  MEDICATIONS:  Current Outpatient Medications  Medication Sig Dispense Refill   aspirin 81 MG tablet Take 81 mg by mouth daily.     atorvastatin (LIPITOR) 20 MG tablet  TAKE 1 TABLET BY MOUTH DAILY. PHYSICAL APPOINTMENT REQUIRED WITH PRESCRIBER FOR REFILLS 90 tablet 3   Continuous Blood Gluc Receiver (Santel) [place] See admin instructions.     Continuous Blood Gluc Sensor (DEXCOM G7 SENSOR) MISC 1 Device by Does not apply route every 14 (fourteen) days. 3 each 11   dapagliflozin propanediol (FARXIGA) 10 MG TABS tablet Take 1 tablet (10 mg total) by mouth daily. 90 tablet 3   glipiZIDE (GLUCOTROL XL) 5 MG 24 hr tablet TAKE 1 TABLET(5 MG) BY MOUTH DAILY WITH BREAKFAST (Patient taking differently: Take 5 mg by mouth every evening.) 90 tablet 0   insulin detemir (LEVEMIR) 100 UNIT/ML FlexPen Inject 34 Units into the skin daily. 15 mL 11   Insulin Pen Needle (BD PEN NEEDLE MICRO U/F) 32G X 6 MM MISC Pt to use with Semglee pen to administer insulin  daily. 30 each 1   lisinopril-hydrochlorothiazide (ZESTORETIC) 20-12.5 MG tablet TAKE 1 TABLET BY MOUTH DAILY 90 tablet 0   metFORMIN (GLUCOPHAGE) 500 MG tablet TAKE 2 TABLETS(1000 MG) BY MOUTH TWICE DAILY WITH A MEAL (Patient taking differently: Take 1,000 mg by mouth 2 (two) times daily with a meal.) 120 tablet 5   pioglitazone (ACTOS) 30 MG tablet Take 1 tablet (30 mg total) by mouth daily. 90 tablet 3   No current facility-administered medications for this visit.    REVIEW OF SYSTEMS:   Constitutional: Denies fevers, chills or abnormal night sweats Eyes: Denies blurriness of vision, double vision or watery eyes Ears, nose, mouth, throat, and face: Denies mucositis or sore throat Respiratory: Denies cough, dyspnea or wheezes Cardiovascular: Denies palpitation, chest discomfort or lower extremity swelling Gastrointestinal:  Denies nausea, heartburn or change in bowel habits Skin: Denies abnormal skin rashes Lymphatics: Denies new lymphadenopathy or easy bruising Neurological:Denies numbness, tingling or new weaknesses Behavioral/Psych: Mood is stable, no new changes  All other systems were reviewed with the patient and are negative.  PHYSICAL EXAMINATION: ECOG PERFORMANCE STATUS: 0 - Asymptomatic  Vitals:   04/07/22 0929  BP: (!) 157/96  Pulse: 79  Resp: 18  Temp: 98.4 F (36.9 C)  SpO2: 98%   Filed Weights   04/07/22 0929  Weight: 194 lb 12.8 oz (88.4 kg)    GENERAL:alert, no distress and comfortable SKIN: skin color, texture, turgor are normal, no rashes or significant lesions EYES: normal, conjunctiva are pink and non-injected, sclera clear OROPHARYNX:no exudate, no erythema and lips, buccal mucosa, and tongue normal  NECK: supple, thyroid normal size, non-tender, without nodularity LYMPH:  no palpable lymphadenopathy in the cervical, axillary or inguinal LUNGS: clear to auscultation and percussion with normal breathing effort HEART: regular rate & rhythm and no  murmurs and no lower extremity edema ABDOMEN:abdomen soft, non-tender and normal bowel sounds Musculoskeletal:no cyanosis of digits and no clubbing  PSYCH: alert & oriented x 3 with fluent speech NEURO: no focal motor/sensory deficits  LABORATORY DATA:  I have reviewed the data as listed Recent Results (from the past 2160 hour(s))  CBC     Status: Abnormal   Collection Time: 03/01/22  9:48 AM  Result Value Ref Range   WBC 14.8 (H) 3.8 - 10.8 Thousand/uL   RBC 6.42 (H) 4.20 - 5.80 Million/uL   Hemoglobin 19.2 (H) 13.2 - 17.1 g/dL    Comment: Verified by repeat analysis. Marland Kitchen    HCT 58.1 (H) 38.5 - 50.0 %    Comment: Verified by repeat analysis. Marland Kitchen    MCV 90.5 80.0 - 100.0 fL  MCH 29.9 27.0 - 33.0 pg   MCHC 33.0 32.0 - 36.0 g/dL   RDW 12.3 11.0 - 15.0 %   Platelets 241 140 - 400 Thousand/uL   MPV 11.6 7.5 - 12.5 fL  Comprehensive Metabolic Panel (CMET)     Status: Abnormal   Collection Time: 03/01/22  9:48 AM  Result Value Ref Range   Glucose, Bld 680 (HH) 65 - 99 mg/dL    Comment: Verified by repeat analysis. Marland Kitchen .            Fasting reference interval . For someone without known diabetes, a glucose value >125 mg/dL indicates that they may have diabetes and this should be confirmed with a follow-up test. .    BUN 23 7 - 25 mg/dL   Creat 1.13 0.70 - 1.35 mg/dL   BUN/Creatinine Ratio SEE NOTE: 6 - 22 (calc)    Comment:    Not Reported: BUN and Creatinine are within    reference range. .    Sodium 135 135 - 146 mmol/L   Potassium 4.8 3.5 - 5.3 mmol/L   Chloride 94 (L) 98 - 110 mmol/L   CO2 25 20 - 32 mmol/L   Calcium 10.9 (H) 8.6 - 10.3 mg/dL   Total Protein 7.2 6.1 - 8.1 g/dL   Albumin 4.4 3.6 - 5.1 g/dL   Globulin 2.8 1.9 - 3.7 g/dL (calc)   AG Ratio 1.6 1.0 - 2.5 (calc)   Total Bilirubin 0.5 0.2 - 1.2 mg/dL   Alkaline phosphatase (APISO) 173 (H) 35 - 144 U/L   AST 33 10 - 35 U/L   ALT 81 (H) 9 - 46 U/L  Fe+TIBC+Fer     Status: Abnormal   Collection Time:  03/01/22  9:48 AM  Result Value Ref Range   Iron 47 (L) 50 - 180 mcg/dL   TIBC 324 250 - 425 mcg/dL (calc)   %SAT 15 (L) 20 - 48 % (calc)   Ferritin 564 (H) 24 - 380 ng/mL  B12 and Folate Panel     Status: None   Collection Time: 03/01/22  9:48 AM  Result Value Ref Range   Vitamin B-12 1,084 200 - 1,100 pg/mL   Folate 12.1 ng/mL    Comment:                            Reference Range                            Low:           <3.4                            Borderline:    3.4-5.4                            Normal:        >5.4 .   CBG monitoring, ED     Status: Abnormal   Collection Time: 03/02/22  1:49 PM  Result Value Ref Range   Glucose-Capillary >600 (HH) 70 - 99 mg/dL    Comment: Glucose reference range applies only to samples taken after fasting for at least 8 hours.  CBC     Status: Abnormal   Collection Time: 03/02/22  1:56 PM  Result Value Ref Range   WBC 12.6 (  H) 4.0 - 10.5 K/uL   RBC 6.28 (H) 4.22 - 5.81 MIL/uL   Hemoglobin 19.1 (H) 13.0 - 17.0 g/dL   HCT 55.2 (H) 39.0 - 52.0 %   MCV 87.9 80.0 - 100.0 fL   MCH 30.4 26.0 - 34.0 pg   MCHC 34.6 30.0 - 36.0 g/dL   RDW 13.8 11.5 - 15.5 %   Platelets 219 150 - 400 K/uL   nRBC 0.0 0.0 - 0.2 %    Comment: Performed at [facility], [address] [place], [place] [address]  Comprehensive metabolic panel     Status: Abnormal   Collection Time: 03/02/22  1:56 PM  Result Value Ref Range   Sodium 132 (L) 135 - 145 mmol/L   Potassium 4.6 3.5 - 5.1 mmol/L   Chloride 95 (L) 98 - 111 mmol/L   CO2 24 22 - 32 mmol/L   Glucose, Bld 754 (HH) 70 - 99 mg/dL    Comment: Glucose reference range applies only to samples taken after fasting for at least 8 hours. CRITICAL RESULT CALLED TO, READ BACK BY AND VERIFIED WITH: WHITE,M ON 03/02/22 AT 1450 BY LOY,C    BUN 28 (H) 8 - 23 mg/dL   Creatinine, Ser 1.13 0.61 - 1.24 mg/dL   Calcium 9.5 8.9 - 10.3 mg/dL   Total Protein 7.3 6.5 - 8.1 g/dL   Albumin 3.9 3.5 - 5.0 g/dL   AST 33 15 - 41  U/L   ALT 67 (H) 0 - 44 U/L   Alkaline Phosphatase 146 (H) 38 - 126 U/L   Total Bilirubin 1.0 0.3 - 1.2 mg/dL   GFR, Estimated >60 >60 mL/min    Comment: (NOTE) Calculated using the CKD-EPI Creatinine Equation (2021)    Anion gap 13 5 - 15    Comment: Performed at [facility], [address] [place], [place] [address]  Blood gas, venous (at [facility], not at [facility])     Status: Abnormal   Collection Time: 03/02/22  3:13 PM  Result Value Ref Range   pH, Ven 7.42 7.25 - 7.43   pCO2, Ven 53 44 - 60 mmHg   pO2, Ven <31 (LL) 32 - 45 mmHg    Comment: CRITICAL RESULT CALLED TO, READ BACK BY AND VERIFIED WITH: CHILDRESS,W ON 03/02/22 AT 1540 BY LOY,C    Bicarbonate 34.2 (H) 20.0 - 28.0 mmol/L   Acid-Base Excess 7.2 (H) 0.0 - 2.0 mmol/L   O2 Saturation 28.6 %   Patient temperature 36.4    Collection site LEFT ANTECUBITAL    Drawn by 1497     Comment: Performed at [facility], [address], [place], [place] [address]  Lactic acid, plasma     Status: Abnormal   Collection Time: 03/02/22  3:13 PM  Result Value Ref Range   Lactic Acid, Venous 2.3 (HH) 0.5 - 1.9 mmol/L    Comment: CRITICAL RESULT CALLED TO, READ BACK BY AND VERIFIED WITH: CHILDRESS,W ON 03/02/22 AT 1600 BY LOY,C Performed at [facility], [address], [place], [place] [address]   Lipase, blood     Status: None   Collection Time: 03/02/22  3:13 PM  Result Value Ref Range   Lipase 48 11 - 51 U/L    Comment: Performed at [facility], [address], [place], [place] [address]  CBG monitoring, ED     Status: Abnormal   Collection Time: 03/02/22  3:34 PM  Result Value Ref Range   Glucose-Capillary >600 (HH)  70 - 99 mg/dL    Comment: Glucose reference range applies only to samples taken after fasting for at least 8 hours.  Urinalysis, Routine w reflex microscopic Urine, Clean Catch     Status: Abnormal   Collection Time: 03/02/22  3:40 PM  Result Value Ref Range   Color, Urine STRAW (A) YELLOW   APPearance CLEAR CLEAR    Specific Gravity, Urine 1.029 1.005 - 1.030   pH 6.0 5.0 - 8.0   Glucose, UA >=500 (A) NEGATIVE mg/dL   Hgb urine dipstick NEGATIVE NEGATIVE   Bilirubin Urine NEGATIVE NEGATIVE   Ketones, ur 5 (A) NEGATIVE mg/dL   Protein, ur NEGATIVE NEGATIVE mg/dL   Nitrite NEGATIVE NEGATIVE   Leukocytes,Ua NEGATIVE NEGATIVE   WBC, UA 0-5 0 - 5 WBC/hpf   Bacteria, UA NONE SEEN NONE SEEN    Comment: Performed at [facility], [address], [place], [place] [address]  CBG monitoring, ED     Status: Abnormal   Collection Time: 03/02/22  4:18 PM  Result Value Ref Range   Glucose-Capillary 510 (HH) 70 - 99 mg/dL    Comment: Glucose reference range applies only to samples taken after fasting for at least 8 hours.   Comment 1 Notify RN    Comment 2 Document in Chart   Lactic acid, plasma     Status: Abnormal   Collection Time: 03/02/22  4:58 PM  Result Value Ref Range   Lactic Acid, Venous 2.8 (HH) 0.5 - 1.9 mmol/L    Comment: CRITICAL VALUE NOTED.  VALUE IS CONSISTENT WITH PREVIOUSLY REPORTED AND CALLED VALUE. Performed at [facility], [address], [place], [place] [address]   CBG monitoring, ED     Status: Abnormal   Collection Time: 03/02/22  6:16 PM  Result Value Ref Range   Glucose-Capillary 310 (H) 70 - 99 mg/dL    Comment: Glucose reference range applies only to samples taken after fasting for at least 8 hours.  Lactic acid, plasma     Status: None   Collection Time: 03/02/22  7:52 PM  Result Value Ref Range   Lactic Acid, Venous 1.8 0.5 - 1.9 mmol/L    Comment: Performed at [facility], [address], [place], [place] [address]  POC CBG, ED     Status: Abnormal   Collection Time: 03/02/22  9:31 PM  Result Value Ref Range   Glucose-Capillary 307 (H) 70 - 99 mg/dL    Comment: Glucose reference range applies only to samples taken after fasting for at least 8 hours.   Comment 1 Notify RN    Comment 2 Document in Chart   Hemoglobin A1c     Status: Abnormal   Collection Time: 03/06/22   9:43 AM  Result Value Ref Range   Hgb A1c MFr Bld >14.0 (H) <5.7 % of total Hgb    Comment: Verified by repeat analysis. . For someone without known diabetes, a hemoglobin A1c value of 6.5% or greater indicates that they may have  diabetes and this should be confirmed with a follow-up  test. . For someone with known diabetes, a value <7% indicates  that their diabetes is well controlled and a value  greater than or equal to 7% indicates suboptimal  control. A1c targets should be individualized based on  duration of diabetes, age, comorbid conditions, and  other considerations. . Currently, no consensus exists regarding use of hemoglobin A1c for diagnosis of diabetes for children. .    Mean Plasma Glucose  mg/dL    Comment: eAG cannot be calculated. Hemoglobin A1c result exceeds the linearity of the assay.   CBC with Differential/Platelet     Status: Abnormal   Collection Time: 03/06/22  9:43 AM  Result Value Ref Range   WBC 12.4 (H) 3.8 - 10.8 Thousand/uL   RBC 6.39 (H) 4.20 - 5.80 Million/uL   Hemoglobin 19.4 (H) 13.2 - 17.1 g/dL    Comment: Verified by repeat analysis. Marland Kitchen    HCT 56.1 (H) 38.5 - 50.0 %   MCV 87.8 80.0 - 100.0 fL   MCH 30.4 27.0 - 33.0 pg   MCHC 34.6 32.0 - 36.0 g/dL   RDW 12.9 11.0 - 15.0 %   Platelets 215 140 - 400 Thousand/uL   MPV 12.0 7.5 - 12.5 fL   Neutro Abs 8,618 (H) 1,500 - 7,800 cells/uL   Lymphs Abs 2,666 850 - 3,900 cells/uL   Absolute Monocytes 992 (H) 200 - 950 cells/uL   Eosinophils Absolute 37 15 - 500 cells/uL   Basophils Absolute 87 0 - 200 cells/uL   Neutrophils Relative % 69.5 %   Total Lymphocyte 21.5 %   Monocytes Relative 8.0 %   Eosinophils Relative 0.3 %   Basophils Relative 0.7 %  COMPLETE METABOLIC PANEL WITH GFR     Status: Abnormal   Collection Time: 03/06/22  9:43 AM  Result Value Ref Range   Glucose, Bld 193 (H) 65 - 99 mg/dL    Comment: .            Fasting reference interval . For someone without known  diabetes, a glucose value >125 mg/dL indicates that they may have diabetes and this should be confirmed with a follow-up test. .    BUN 15 7 - 25 mg/dL   Creat 0.87 0.70 - 1.35 mg/dL   eGFR 97 > OR = 60 mL/min/1.73m   BUN/Creatinine Ratio SEE NOTE: 6 - 22 (calc)    Comment:    Not Reported: BUN and Creatinine are within    reference range. .    Sodium 137 135 - 146 mmol/L   Potassium 4.3 3.5 - 5.3 mmol/L   Chloride 99 98 - 110 mmol/L   CO2 25 20 - 32 mmol/L   Calcium 10.0 8.6 - 10.3 mg/dL   Total Protein 6.9 6.1 - 8.1 g/dL   Albumin 4.3 3.6 - 5.1 g/dL   Globulin 2.6 1.9 - 3.7 g/dL (calc)   AG Ratio 1.7 1.0 - 2.5 (calc)   Total Bilirubin 0.6 0.2 - 1.2 mg/dL   Alkaline phosphatase (APISO) 105 35 - 144 U/L   AST 53 (H) 10 - 35 U/L   ALT 69 (H) 9 - 46 U/L    RADIOGRAPHIC STUDIES: I have personally reviewed the radiological images as listed and agreed with the findings in the report. No results found.  ASSESSMENT:  1.  Erythrocytosis: - Patient seen at the request of Dr. PDennard Schaumann - CBC (03/06/2022): WBC-12.4, Hb-19.4, HCT-56, RBC-6.39, PLT-215, ANC-8.6 - Review of labs show elevated hemoglobin ranging between 17.7-19.5 since June 2015.  WBC intermittently elevated since 2019.  Platelet count normal. - Sleep study (01/11/2018): No significant obstructive or central sleep disorder with the exception of snoring and supine sleep apnea. - 10/26/2017: BCR/ABL by PCR negative.  JAK2 V617F, exon 12 negative.  Hemochromatosis panel negative. - No aquagenic pruritus/vasomotor symptoms/thrombosis  2.  Social/family history: - Lives at home with his wife.  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 - Current active smoker, 1 pack/day for 45 years. - Mother had to have phlebotomies later in her life.  No family history of malignancies.   PLAN:  1.  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 - Previously  tested for myeloproliferative neoplasms and were negative.  He was not tested for MPL and CALR which are less likely to cause polycythemia. - Will check serum erythropoietin level.  2.  Smoking history: - Because of his smoking history, I have recommended CT chest low-dose scan.  He is agreeable.  Will schedule it prior to next visit.   All questions were answered. The patient knows to call the clinic with any problems, questions or concerns.     Derek Jack, MD 04/07/22 2:56 PM

## 2022-04-15 ENCOUNTER — Telehealth: Payer: Self-pay | Admitting: Gastroenterology

## 2022-04-15 NOTE — Telephone Encounter (Signed)
ED notes reviewed from [facility] in [place], [place].  In 02/16/2022.  Patient was able to ability to swallow solid foods for 5 days.  Also with reported 20 pound weight loss that was unintentional.  Went to urgent care initially and then was sent to ED.  Reported lightheaded with standing.  Only having liquids for the last 5 days.  Noted to have elevated blood glucose, elevated potassium 5.5, creatinine 1.19, hematocrit 55, platelets 270 hemoglobin 19.3, WBC 14.4.  CT soft tissue neck with contrast with no evidence of epiglottitis or tonsillitis.  No abscess in the neck.  Rapid strep test negative patient requested to follow-up closer to home.  While in the ED he received 2 L of IV fluids and 1 dose of Rocephin.  Blood cultures with no growth for 5 days.   Patient recently reported improvement with dysphagia and declines rescheduling upper endoscopy.  Also recently stated blood glucose was much more improved.  Per chart review patient has seen hematology regarding erythrocytosis.  Venetia Night, MSN, APRN, FNP-BC, AGACNP-BC [facility] at [facility]

## 2022-05-05 ENCOUNTER — Ambulatory Visit (HOSPITAL_COMMUNITY)
Admission: RE | Admit: 2022-05-05 | Discharge: 2022-05-05 | Disposition: A | Discharge status: Home / Self Care | Payer: BC Managed Care – PPO | Source: Ambulatory Visit | Attending: Hematology | Admitting: Hematology

## 2022-05-05 ENCOUNTER — Inpatient Hospital Stay: Payer: BC Managed Care – PPO | Attending: Hematology

## 2022-05-05 DIAGNOSIS — I2510 Atherosclerotic heart disease of native coronary artery without angina pectoris: Secondary | ICD-10-CM | POA: Insufficient documentation

## 2022-05-05 DIAGNOSIS — Z122 Encounter for screening for malignant neoplasm of respiratory organs: Secondary | ICD-10-CM | POA: Insufficient documentation

## 2022-05-05 DIAGNOSIS — I700 Atherosclerosis of aorta: Secondary | ICD-10-CM | POA: Diagnosis not present

## 2022-05-05 DIAGNOSIS — D751 Secondary polycythemia: Secondary | ICD-10-CM | POA: Diagnosis not present

## 2022-05-05 DIAGNOSIS — Z87891 Personal history of nicotine dependence: Secondary | ICD-10-CM | POA: Insufficient documentation

## 2022-05-05 DIAGNOSIS — F17210 Nicotine dependence, cigarettes, uncomplicated: Secondary | ICD-10-CM | POA: Diagnosis not present

## 2022-05-05 DIAGNOSIS — I517 Cardiomegaly: Secondary | ICD-10-CM | POA: Insufficient documentation

## 2022-05-06 LAB — ERYTHROPOIETIN: Erythropoietin: 9.9 m[IU]/mL (ref 2.6–18.5)

## 2022-05-08 ENCOUNTER — Ambulatory Visit (INDEPENDENT_AMBULATORY_CARE_PROVIDER_SITE_OTHER): Payer: BC Managed Care – PPO | Admitting: Gastroenterology

## 2022-05-10 ENCOUNTER — Inpatient Hospital Stay (HOSPITAL_BASED_OUTPATIENT_CLINIC_OR_DEPARTMENT_OTHER): Payer: BC Managed Care – PPO | Admitting: Hematology

## 2022-05-10 DIAGNOSIS — D751 Secondary polycythemia: Secondary | ICD-10-CM | POA: Diagnosis not present

## 2022-05-10 NOTE — Progress Notes (Signed)
Virtual Visit via Telephone Note  I connected with Collin Farmer on 05/10/22 at  3:45 PM EST by telephone and verified that I am speaking with the correct person using two identifiers.  Location: Patient: home Provider: office   I discussed the limitations, risks, security and privacy concerns of performing an evaluation and management service by telephone and the availability of in person appointments. I also discussed with the patient that there may be a patient responsible charge related to this service. The patient expressed understanding and agreed to proceed.   History of Present Illness: Collin Farmer 62 y.o. male is seen at the request of Dr. Dennard Schaumann for further workup and management of elevated hemoglobin and RBC mass.  Labs on 03/06/2022 showed white count 12.4, hemoglobin 19.4, hematocrit 56, RBC 6.39 with normal platelet count.  Differential showed elevated absolute neutrophil count.  Absolute monocyte count was also slightly elevated.  He had a sleep apnea testing done in 2019 which was negative.  He reports energy levels are 100%.  Denies any fatigue or headaches.  Denies any aquagenic pruritus.  No vasomotor symptoms.  No DVT, MI or CVA.   He was last seen by me on 04/07/22. Today, he states that he is doing well overall. His appetite level is at 100%. His energy level is at 100%. He denies any headaches, vision changes, itching/dry skin, or skin pigmentation changes.    Observations/Objective: He denies any headaches or vision changes.  Denies any aquagenic pruritus or vasomotor symptoms.  Denies any chest pains or shortness of breath.  Assessment and Plan: ASSESSMENT:   1.  Erythrocytosis: - Patient seen at the request of Dr. Dennard Schaumann. - CBC (03/06/2022): WBC-12.4, Hb-19.4, HCT-56, RBC-6.39, PLT-215, ANC-8.6 - Review of labs show elevated hemoglobin ranging between 17.7-19.5 since June 2015.  WBC intermittently elevated since 2019.  Platelet count normal. - Sleep study  (01/11/2018): No significant obstructive or central sleep disorder with the exception of snoring and supine sleep apnea. - 10/26/2017: BCR/ABL by PCR negative.  JAK2 V617F, exon 12 negative.  Hemochromatosis panel negative. - No aquagenic pruritus/vasomotor symptoms/thrombosis   2.  Social/family history: - Lives at home with his wife.  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 - Current active smoker, 1 pack/day for 45 years. - Mother had to have phlebotomies later in her life.  No family history of malignancies.     PLAN:   1.  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 - Serum EPO level is 9.9.  Bone marrow biopsy is not needed as previous testing for MPN was negative. - I have recommended that he donate blood at [facility]/1 blood every 2 to 3 months. - RTC 6 months with repeat CBC.   2.  Smoking history: - We reviewed results of CT chest, lung RADS 1S. - I have recommended repeat CT scan without contrast in 12 months. - I have also discussed other findings including three-vessel coronary artery calcifications, atherosclerosis and emphysema.  Mildly dilated ascending thoracic aorta measuring up to 4 cm was also discussed. - He does not have any chest pain or heart related symptoms at this time.  He will call us or Dr. Dennard Schaumann for referral to cardiology should he develop any symptoms.   Follow Up Instructions: RTC 6 months with CBC on same day.   I discussed the assessment and treatment plan with the patient. The patient was provided an opportunity to ask questions and all were answered.  The patient agreed with the plan and demonstrated an understanding of the instructions.   The patient was advised to call back or seek an in-person evaluation if the symptoms worsen or if the condition fails to improve as anticipated.  I provided 21 minutes of non-face-to-face time during this encounter.    I,Alexis  Herring,acting as a Education administrator for [employer], MD.,have documented all relevant documentation on the behalf of Derek Jack, MD,as directed by  Derek Jack, MD while in the presence of Derek Jack, MD.  I, Derek Jack MD, have reviewed the above documentation for accuracy and completeness, and I agree with the above.   [employer]

## 2022-05-10 NOTE — Progress Notes (Signed)
Patient notified of LDCT Lung Cancer Screening Results via mail with the recommendation to follow-up in 12 months. Patient's referring provider has been sent a copy of results. Results are as follows:   IMPRESSION: 1. Lung-RADS 1S, negative. Continue annual screening with low-dose chest CT without contrast in 12 months. S modifier for coronary artery calcifications and mildly dilated ascending thoracic aorta. 2. Mildly dilated ascending thoracic aorta measuring up to 4.0 cm. Recommend attention on annual lung cancer screening CT follow-up. 3. Severe three-vessel coronary artery calcifications. 4. Aortic Atherosclerosis (ICD10-I70.0) and Emphysema (ICD10-J43.9).

## 2022-05-11 ENCOUNTER — Telehealth: Payer: Self-pay

## 2022-05-11 NOTE — Telephone Encounter (Signed)
05/11/22 @ 12:06 pm-No answer and no voice mail set up. Mjp,lpn  Susy Frizzle, MD  Chriss Driver, LPN I want to see this patient back to review his sugars at ov next week and discuss CT       Previous Messages    ----- Message ----- From: Brien Mates, RN Sent: 05/10/2022  10:27 AM EST To: Susy Frizzle, MD  Hello!  Dr. Delton Coombes saw this patient as a new hematology patient and recommended LDCT. I am just attaching those results so you are aware. We will continue with yearly LDCTs.  IMPRESSION: 1. Lung-RADS 1S, negative. Continue annual screening with low-dose chest CT without contrast in 12 months. S modifier for coronary artery calcifications and mildly dilated ascending thoracic aorta. 2. Mildly dilated ascending thoracic aorta measuring up to 4.0 cm. Recommend attention on annual lung cancer screening CT follow-up. 3. Severe three-vessel coronary artery calcifications. 4. Aortic Atherosclerosis (ICD10-I70.0) and Emphysema (ICD10-J43.

## 2022-05-16 ENCOUNTER — Ambulatory Visit: Payer: BC Managed Care – PPO | Admitting: Family Medicine

## 2022-05-16 ENCOUNTER — Encounter: Payer: Self-pay | Admitting: Family Medicine

## 2022-05-16 VITALS — BP 126/68 | HR 81 | Temp 98.5°F | Ht 67.0 in | Wt 190.0 lb

## 2022-05-16 DIAGNOSIS — I2510 Atherosclerotic heart disease of native coronary artery without angina pectoris: Secondary | ICD-10-CM | POA: Diagnosis not present

## 2022-05-16 DIAGNOSIS — F17200 Nicotine dependence, unspecified, uncomplicated: Secondary | ICD-10-CM

## 2022-05-16 DIAGNOSIS — E119 Type 2 diabetes mellitus without complications: Secondary | ICD-10-CM

## 2022-05-16 MED ORDER — ATORVASTATIN CALCIUM 80 MG PO TABS: 80.0000 mg | ORAL_TABLET | Freq: Every day | ORAL | 3 refills | Status: DC

## 2022-05-16 NOTE — Progress Notes (Signed)
Subjective:    Patient ID: Collin Farmer, male    DOB: November 24, 1960, 62 y.o.   MRN: [id number] Please see my last office visit.  At that time his hemoglobin A1c was greater than 14.  I recommended starting Levemir.  The patient states that since he saw me, he is completely and totally changed his diet.  He has been off his insulin for more than a month.  However he has continuous blood glucose monitoring now.  His time in range is 92% over the last 30 days!  He has less than 1% hypoglycemia.  Therefore his sugars are extremely well-controlled averaging between 80 and 160 the vast majority of the time.  Unfortunately he continues to smoke.  His blood pressure is well-controlled.  Recently I performed a CT scan of the chest to screen for lung cancer and severe three-vessel coronary artery disease was seen.  The patient is asymptomatic and denies any angina or shortness of breath with activity however he has significant risk factors including his diabetes smoking and hypertension.  We have not checked his cholesterol in over a year because he is never fasting. Past Medical History:  Diagnosis Date   Diabetes mellitus type 2 in nonobese [facility])    History of kidney stones    HLD (hyperlipidemia)    Hypertension    Polycythemia    Smoking    Current Outpatient Medications on File Prior to Visit  Medication Sig Dispense Refill   aspirin 81 MG tablet Take 81 mg by mouth daily.     atorvastatin (LIPITOR) 20 MG tablet TAKE 1 TABLET BY MOUTH DAILY. PHYSICAL APPOINTMENT REQUIRED WITH PRESCRIBER FOR REFILLS 90 tablet 3   Continuous Blood Gluc Receiver (Blunt) [place] See admin instructions.     Continuous Blood Gluc Sensor (DEXCOM G7 SENSOR) MISC 1 Device by Does not apply route every 14 (fourteen) days. 3 each 11   dapagliflozin propanediol (FARXIGA) 10 MG TABS tablet Take 1 tablet (10 mg total) by mouth daily. 90 tablet 3   glipiZIDE (GLUCOTROL XL) 5 MG 24 hr tablet TAKE 1 TABLET(5 MG) BY  MOUTH DAILY WITH BREAKFAST (Patient taking differently: Take 5 mg by mouth every evening.) 90 tablet 0   Insulin Pen Needle (BD PEN NEEDLE MICRO U/F) 32G X 6 MM MISC Pt to use with Semglee pen to administer insulin daily. 30 each 1   lisinopril-hydrochlorothiazide (ZESTORETIC) 20-12.5 MG tablet TAKE 1 TABLET BY MOUTH DAILY 90 tablet 0   metFORMIN (GLUCOPHAGE) 500 MG tablet TAKE 2 TABLETS(1000 MG) BY MOUTH TWICE DAILY WITH A MEAL (Patient taking differently: Take 1,000 mg by mouth 2 (two) times daily with a meal.) 120 tablet 5   pioglitazone (ACTOS) 30 MG tablet Take 1 tablet (30 mg total) by mouth daily. 90 tablet 3   No current facility-administered medications on file prior to visit.   Allergies  Allergen Reactions   Bee Venom Anaphylaxis   Other     Bee stings   Social History   Socioeconomic History   Marital status: Single    Spouse name: Not on file   Number of children: Not on file   Years of education: Not on file   Highest education level: Not on file  Occupational History   Not on file  Tobacco Use   Smoking status: Every Day    Packs/day: 0.50    Years: 37.00    Total pack years: 18.50    Types: Cigarettes  Smokeless tobacco: Never  Substance and Sexual Activity   Alcohol use: No    Alcohol/week: 0.0 standard drinks of alcohol   Drug use: No   Sexual activity: Never    Birth control/protection: Abstinence  Other Topics Concern   Not on file  Social History Narrative   Not on file   Social Determinants of Health   Financial Resource Strain: Not on file  Food Insecurity: No Food Insecurity (04/07/2022)   Hunger Vital Sign    Worried About Running Out of Food in the Last Year: Never true    Ran Out of Food in the Last Year: Never true  Transportation Needs: No Transportation Needs (04/07/2022)   PRAPARE - Hydrologist (Medical): No    Lack of Transportation (Non-Medical): No  Physical Activity: Not on file  Stress: Not on file   Social Connections: Not on file  Intimate Partner Violence: Not At Risk (04/07/2022)   Humiliation, Afraid, Rape, and Kick questionnaire    Fear of Current or Ex-Partner: No    Emotionally Abused: No    Physically Abused: No    Sexually Abused: No      Review of Systems  All other systems reviewed and are negative.      Objective:   Physical Exam Vitals reviewed.  Constitutional:      General: He is not in acute distress.    Appearance: He is well-developed. He is not diaphoretic.  HENT:     Head: Normocephalic and atraumatic.  Eyes:     General: No scleral icterus.    Conjunctiva/sclera: Conjunctivae normal.  Neck:     Thyroid: No thyromegaly.     Vascular: No JVD.  Cardiovascular:     Rate and Rhythm: Normal rate and regular rhythm.     Heart sounds: Normal heart sounds. No murmur heard.    No friction rub. No gallop.  Pulmonary:     Effort: Pulmonary effort is normal. No respiratory distress.     Breath sounds: Normal breath sounds. No wheezing or rales.  Chest:     Chest wall: No tenderness.  Abdominal:     General: Bowel sounds are normal. There is no distension.     Palpations: Abdomen is soft. There is no mass.     Tenderness: There is no abdominal tenderness. There is no guarding or rebound.  Musculoskeletal:     Cervical back: Neck supple.  Lymphadenopathy:     Cervical: No cervical adenopathy.           Assessment & Plan:  Coronary artery calcification seen on CAT scan - Plan: Ambulatory referral to Cardiology  Diabetes mellitus type 2 in nonobese (HCC)  Smoking Blood sugars are now well-controlled with liver 92% of his sugars within range despite not taking insulin for over a month.  Continue the Farxiga, metformin, glipizide, and Actos as this combination seems to be working well.  He was unable to afford a GLP-1 agonist.  Discontinue Levemir.  Blood pressure is well-controlled.  Return fasting for fasting lipid panel.  I would like his LDL  cholesterol to be less than 70.  I will increase the patient's atorvastatin to 80 mg.  Consult cardiology for possible CT to evaluate for any flow-limiting lesions given his risk factors.

## 2022-05-19 ENCOUNTER — Ambulatory Visit: Payer: BC Managed Care – PPO | Admitting: Family Medicine

## 2022-05-23 ENCOUNTER — Telehealth: Payer: Self-pay | Admitting: Family Medicine

## 2022-05-23 NOTE — Telephone Encounter (Signed)
Patient called to follow up on recent script for atorvastatin (LIPITOR) 80 MG tablet [id number] . Stated he's been taking 20MG once daily but new script is for 80MG once daily.   Patient stated he doesn't recall discussing a dose increase with the provider and wants to confirm the increase before taking it.  Patient doesn't have good reception at this job; please leave a detailed voicemail.   Please advise at [phone].

## 2022-06-01 ENCOUNTER — Other Ambulatory Visit: Payer: Self-pay | Admitting: Family Medicine

## 2022-06-01 NOTE — Telephone Encounter (Signed)
Prescription Request  06/01/2022   LOV: 05/16/2022  What is the name of the medication or equipment?   pioglitazone (ACTOS) 30 MG tablet  **NO REFILLS ON CURRENT SCRIPT. PATIENT HAS 6 PILLS LEFT**.   Have you contacted your pharmacy to request a refill? Yes   Which pharmacy would you like this sent to?  [facility] [phone] - [place], [place] AT [place] [id number] [place] [address] Phone: [phone] Fax: [phone]    Patient notified that their request is being sent to the clinical staff for review and that they should receive a response within 2 business days.   Please advise patient when refill sent in at [phone].

## 2022-06-02 MED ORDER — PIOGLITAZONE HCL 30 MG PO TABS: 30.0000 mg | ORAL_TABLET | Freq: Every day | ORAL | 0 refills | Status: DC

## 2022-06-05 ENCOUNTER — Other Ambulatory Visit: Payer: Self-pay | Admitting: Family Medicine

## 2022-06-05 ENCOUNTER — Telehealth: Payer: Self-pay | Admitting: Family Medicine

## 2022-06-05 DIAGNOSIS — I10 Essential (primary) hypertension: Secondary | ICD-10-CM

## 2022-06-05 NOTE — Telephone Encounter (Signed)
Prescription Request  06/05/2022  LOV: 05/16/2022  What is the name of the medication or equipment?   buPROPion (WELLBUTRIN XL) 150 MG 24 hr tablet [id number]  DISCONTINUED  *PATIENT STATED PHARMACY DECLINED REFILL REQUEST.**  Have you contacted your pharmacy to request a refill? Yes   Which pharmacy would you like this sent to?  [facility] [phone] - [place], [place] AT [place] [id number] [place] [address] Phone: [phone] Fax: [phone]    Patient notified that their request is being sent to the clinical staff for review and that they should receive a response within 2 business days.   Please advise patient at [phone].

## 2022-06-05 NOTE — Telephone Encounter (Signed)
This medication was discontinue back  on 12/23.  Do you still want pt on this medication? Pls advice

## 2022-06-06 ENCOUNTER — Other Ambulatory Visit: Payer: Self-pay | Admitting: Family Medicine

## 2022-06-06 MED ORDER — BUPROPION HCL ER (XL) 150 MG PO TB24: 150.0000 mg | ORAL_TABLET | Freq: Every day | ORAL | 11 refills | Status: DC

## 2022-07-17 ENCOUNTER — Telehealth: Payer: Self-pay

## 2022-07-17 ENCOUNTER — Other Ambulatory Visit: Payer: Self-pay

## 2022-07-17 DIAGNOSIS — I10 Essential (primary) hypertension: Secondary | ICD-10-CM

## 2022-07-17 DIAGNOSIS — E119 Type 2 diabetes mellitus without complications: Secondary | ICD-10-CM

## 2022-07-17 MED ORDER — GLIPIZIDE ER 5 MG PO TB24: 5.0000 mg | ORAL_TABLET | Freq: Every evening | ORAL | 1 refills | Status: DC

## 2022-07-17 MED ORDER — METFORMIN HCL 500 MG PO TABS: 1000.0000 mg | ORAL_TABLET | Freq: Two times a day (BID) | ORAL | 1 refills | Status: DC

## 2022-07-17 MED ORDER — LISINOPRIL-HYDROCHLOROTHIAZIDE 20-12.5 MG PO TABS: 1.0000 | ORAL_TABLET | Freq: Every day | ORAL | 0 refills | Status: DC

## 2022-07-17 NOTE — Telephone Encounter (Signed)
Prescription Request  07/17/2022  LOV: 05/16/22  What is the name of the medication or equipment? metFORMIN (GLUCOPHAGE) 500 MG tablet [[id number]]  Have you contacted your pharmacy to request a refill? No   Which pharmacy would you like this sent to?  [facility] [phone] - [place], [place] - [address] AT [facility] OF [address] [place] [address] Phone: [phone] Fax: [phone]    Patient notified that their request is being sent to the clinical staff for review and that they should receive a response within 2 business days.   Please advise at [facility] [phone]  Prescription Request  07/17/2022  LOV: 05/16/22  What is the name of the medication or equipment? glipiZIDE (GLUCOTROL XL) 5 MG 24 hr tablet [[id number]]  Have you contacted your pharmacy to request a refill? No   Which pharmacy would you like this sent to?  [facility] [phone] - [place], [place] - [address] AT [facility] OF [address] [place] [address] Phone: [phone] Fax: [phone]    Patient notified that their request is being sent to the clinical staff for review and that they should receive a response within 2 business days.   Please advise at [facility] [phone]  Prescription Request  07/17/2022  LOV: 05/16/22  What is the name of the medication or equipment? lisinopril-hydrochlorothiazide (ZESTORETIC) 20-12.5 MG  Have you contacted your pharmacy to request a refill? No   Which pharmacy would you like this sent to?  [facility] [phone] - [place], [place] - [address] AT [facility] OF [address] [place] [address] Phone: [phone] Fax: [phone]    Patient notified that their request is being sent to the clinical staff for review and that they should receive a response within 2 business days.   Please advise at [facility] [phone]

## 2022-07-20 ENCOUNTER — Encounter: Payer: Self-pay | Admitting: Internal Medicine

## 2022-07-20 ENCOUNTER — Ambulatory Visit: Payer: BC Managed Care – PPO | Attending: Internal Medicine | Admitting: Internal Medicine

## 2022-07-20 ENCOUNTER — Ambulatory Visit: Payer: BC Managed Care – PPO | Admitting: Internal Medicine

## 2022-07-20 VITALS — BP 118/68 | HR 72 | Ht 68.0 in | Wt 191.6 lb

## 2022-07-20 DIAGNOSIS — Z136 Encounter for screening for cardiovascular disorders: Secondary | ICD-10-CM

## 2022-07-20 DIAGNOSIS — I2510 Atherosclerotic heart disease of native coronary artery without angina pectoris: Secondary | ICD-10-CM | POA: Diagnosis not present

## 2022-07-20 DIAGNOSIS — I10 Essential (primary) hypertension: Secondary | ICD-10-CM

## 2022-07-20 DIAGNOSIS — Z01810 Encounter for preprocedural cardiovascular examination: Secondary | ICD-10-CM | POA: Diagnosis not present

## 2022-07-20 MED ORDER — METOPROLOL TARTRATE 100 MG PO TABS: 100.0000 mg | ORAL_TABLET | Freq: Once | ORAL | 0 refills | Status: DC

## 2022-07-20 NOTE — Patient Instructions (Addendum)
Medication Instructions:  Your physician recommends that you continue on your current medications as directed. Please refer to the Current Medication list given to you today.  Labwork: BMET in 1-2 weeks at [facility] ([address] [place]. [place]) Non-fasting  Testing/Procedures: Coronary CTA-see instructions below  Follow-Up: Your physician recommends that you schedule a follow-up appointment in: pending   Any Other Special Instructions Will Be Listed Below (If Applicable).  If you need a refill on your cardiac medications before your next appointment, please call your pharmacy.    Your cardiac CT will be scheduled at one of the below locations:   [facility] [address] [place], [place] [address] [phone]  If scheduled at [facility], please arrive at the [facility] and Children's Entrance (Entrance C2) of [facility] 30 minutes prior to test start time. You can use the FREE valet parking offered at entrance C (encouraged to control the heart rate for the test)  Proceed to the [facility] Radiology Department (first floor) to check-in and test prep.  All radiology patients and guests should use entrance C2 at [facility], accessed from [facility], even though the hospital's physical address listed is [address].    Please follow these instructions carefully (unless otherwise directed):  Hold all erectile dysfunction medications at least 3 days (72 hrs) prior to test. (Ie viagra, cialis, sildenafil, tadalafil, etc) We will administer nitroglycerin during this exam.   On the Night Before the Test: Be sure to Drink plenty of water. Do not consume any caffeinated/decaffeinated beverages or chocolate 12 hours prior to your test. Do not take any antihistamines 12 hours prior to your test. If the patient has contrast allergy: No contrast allergy  On the Day of the Test: Drink plenty of water until 1 hour prior to the  test. Do not eat any food 1 hour prior to test. You may take your regular medications prior to the test except metformin, pioglitazone, glipizide, farxiga, lisinopril/hctz Take metoprolol (Lopressor) 100 mg two hours prior to test. If you take Furosemide/Hydrochlorothiazide/Spironolactone, please HOLD on the morning of the test.      After the Test: Drink plenty of water. After receiving IV contrast, you may experience a mild flushed feeling. This is normal. On occasion, you may experience a mild rash up to 24 hours after the test. This is not dangerous. If this occurs, you can take Benadryl 25 mg and increase your fluid intake. If you experience trouble breathing, this can be serious. If it is severe call 911 IMMEDIATELY. If it is mild, please call our office. If you take any of these medications: Metformin, please do not take 48 hours after completing test unless otherwise instructed.  We will call to schedule your test 2-4 weeks out understanding that some insurance companies will need an authorization prior to the service being performed.   For non-scheduling related questions, please contact the cardiac imaging nurse navigator should you have any questions/concerns: Rockwell [place], Cardiac Imaging Nurse Navigator Larey Brick, Cardiac Imaging Nurse Navigator [facility] Direct Office Dial: [phone]   For scheduling needs, including cancellations and rescheduling, please call [place], [phone].

## 2022-07-20 NOTE — Progress Notes (Addendum)
Cardiology Office Note  Date: 07/20/2022   ID: Collin Farmer, DOB July 19, 1960, MRN [id number]  PCP:  Donita Brooks, MD  Cardiologist:  Marjo Bicker, MD Electrophysiologist:  None   Reason for Office Visit: Evaluation of coronary artery calcifications at the request of Dr. Tanya Nones   History of Present Illness: Collin Farmer is a 62 y.o. male known to have HTN, DM 2, HLD, nicotine abuse was referred to cardiology clinic for evaluation of coronary artery calcifications.  Patient underwent CT chest lung cancer screening on 05/05/2022 that showed severe three-vessel coronary artery calcifications for which patient was referred to cardiology clinic.  He is currently on aspirin 81 mg once daily and atorvastatin 80 mg nightly already.  He has left arm tingling as soon as he wakes up in the morning and also at random times throughout the day.  He did not notice this with exertion.  He did not have any angina at the same time.  Denies any angina ever, DOE, dizziness, syncope, palpitations or leg swelling.  He currently smokes 1 pack/day and is trying very hard to quit.  No claudication.  Past Medical History:  Diagnosis Date   Diabetes mellitus type 2 in nonobese    History of kidney stones    HLD (hyperlipidemia)    Hypertension    Polycythemia    Smoking     Past Surgical History:  Procedure Laterality Date   NO PAST SURGERIES      Current Outpatient Medications  Medication Sig Dispense Refill   aspirin 81 MG tablet Take 81 mg by mouth daily.     atorvastatin (LIPITOR) 80 MG tablet Take 1 tablet (80 mg total) by mouth daily. 90 tablet 3   buPROPion (WELLBUTRIN XL) 150 MG 24 hr tablet Take 1 tablet (150 mg total) by mouth daily. 30 tablet 11   Continuous Blood Gluc Receiver (DEXCOM G7 RECEIVER) [place] See admin instructions.     Continuous Blood Gluc Sensor (DEXCOM G7 SENSOR) MISC 1 Device by Does not apply route every 14 (fourteen) days. 3 each 11   dapagliflozin propanediol  (FARXIGA) 10 MG TABS tablet Take 1 tablet (10 mg total) by mouth daily. 90 tablet 3   glipiZIDE (GLUCOTROL XL) 5 MG 24 hr tablet Take 1 tablet (5 mg total) by mouth every evening. 90 tablet 1   Insulin Pen Needle (BD PEN NEEDLE MICRO U/F) 32G X 6 MM MISC Pt to use with Semglee pen to administer insulin daily. 30 each 1   lisinopril-hydrochlorothiazide (ZESTORETIC) 20-12.5 MG tablet Take 1 tablet by mouth daily. 90 tablet 0   metFORMIN (GLUCOPHAGE) 500 MG tablet Take 2 tablets (1,000 mg total) by mouth 2 (two) times daily with a meal. 180 tablet 1   pioglitazone (ACTOS) 30 MG tablet Take 1 tablet (30 mg total) by mouth daily. 90 tablet 0   No current facility-administered medications for this visit.   Allergies:  Bee venom and Other   Social History: The patient  reports that he has been smoking cigarettes. He has a 18.50 pack-year smoking history. He has never used smokeless tobacco. He reports that he does not drink alcohol and does not use drugs.   Family History: The patient's family history includes CAD in his mother; COPD in his father; Diabetes in his paternal grandfather and sister; GI Bleed in his mother; Heart attack in his father and paternal grandmother; Prostate cancer in his father.   ROS:  Please see the history of present illness.  Otherwise, complete review of systems is positive for none  All other systems are reviewed and negative.   Physical Exam: VS:  BP 118/68   Pulse 72   Ht  (1.727 m)   Wt 191 lb 9.6 oz (86.9 kg)   SpO2 96%   BMI 29.13 kg/m , BMI Body mass index is 29.13 kg/m.  Wt Readings from Last 3 Encounters:  07/20/22 191 lb 9.6 oz (86.9 kg)  05/16/22 190 lb (86.2 kg)  04/07/22 194 lb 12.8 oz (88.4 kg)    General: Patient appears comfortable at rest. HEENT: Conjunctiva and lids normal, oropharynx clear with moist mucosa. Neck: Supple, no elevated JVP or carotid bruits, no thyromegaly. Lungs: Clear to auscultation, nonlabored breathing at  rest. Cardiac: Regular rate and rhythm, no S3 or significant systolic murmur, no pericardial rub. Abdomen: Soft, nontender, no hepatomegaly, bowel sounds present, no guarding or rebound. Extremities: No pitting edema, distal pulses 2+. Skin: Warm and dry. Musculoskeletal: No kyphosis. Neuropsychiatric: Alert and oriented x3, affect grossly appropriate.  Recent Labwork: 03/06/2022: ALT 69; AST 53; BUN 15; Creat 0.87; Hemoglobin 19.4; Platelets 215; Potassium 4.3; Sodium 137     Component Value Date/Time   CHOL 134 12/03/2020 0946   TRIG 90 12/03/2020 0946   HDL 48 12/03/2020 0946   CHOLHDL 2.8 12/03/2020 0946   VLDL 13 01/08/2015 0833   LDLCALC 69 12/03/2020 0946    Other Studies Reviewed Today:   Assessment and Plan: Patient is a 62 year old M known to have HTN, DM 2, HLD was referred to cardiology clinic for evaluation of coronary artery calcifications.   # Severe three-vessel coronary artery calcifications: Obtain CTA cardiac.  If FFR positive, patient will be seen every 1 year and reassess for new symptoms of DOE or angina. # HTN, controlled: Continue lisinopril-HCTZ 20-12.5 mg once daily # HLD: Continue atorvastatin 80 mg nightly, goal LDL less than 100 # Nicotine abuse: Smoking cessation instruction/counseling given:  counseled patient on the dangers of tobacco use, advised patient to stop smoking, and reviewed strategies to maximize success, currently smokes 1 pack/day and is interested to quit.   I have spent a total of 45 minutes with patient reviewing chart, EKGs, labs and examining patient as well as establishing an assessment and plan that was discussed with the patient.  > 50% of time was spent in direct patient care.   Medication Adjustments/Labs and Tests Ordered: Current medicines are reviewed at length with the patient today.  Concerns regarding medicines are outlined above.   Tests Ordered: Orders Placed This Encounter  Procedures   CT CORONARY MORPH W/CTA COR  W/SCORE W/CA W/CM &/OR WO/CM   Basic metabolic panel   EKG 12-Lead     Medication Changes: Meds ordered this encounter  Medications   metoprolol tartrate (LOPRESSOR) 100 MG tablet    Sig: Take 1 tablet (100 mg total) by mouth once for 1 dose.    Dispense:  1 tablet    Refill:  0     Disposition:  Follow up  pending results  Signed Kalix Meinecke Verne Spurr, MD, 07/20/2022 9:33 AM    [facility] at [facility] [address] [place], [place], [place] [address]

## 2022-07-23 IMAGING — MR MR SHOULDER*R* W/O CM
4 of 6 series · 23 of 40 positions shown · non-contrast
Comparison: None.

CLINICAL DATA: Right shoulder pain with decreased range of motion
for 2 months. No known injury.

EXAM:
MRI OF THE RIGHT SHOULDER WITHOUT CONTRAST
TECHNIQUE: Multiplanar, multisequence MR imaging of the shoulder was performed.
No intravenous contrast was administered.

[Series 6: T2 fat-sat · axial · right · 3.0mm · 0.47mm/px · z∈[-57,+46]mm · 8 of 30 slices shown (1 of 2)]
[im 1/30]
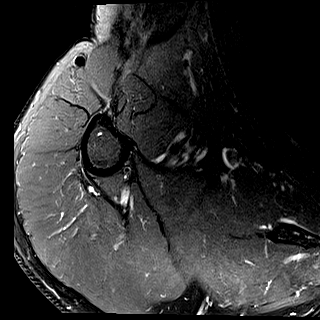
[im 5/30]
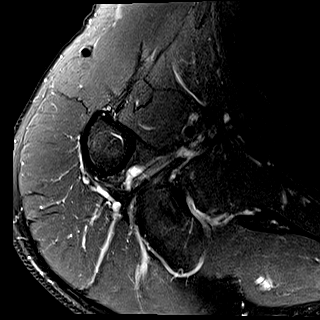
[im 9/30]
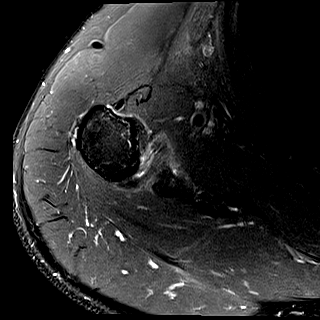
[im 13/30]
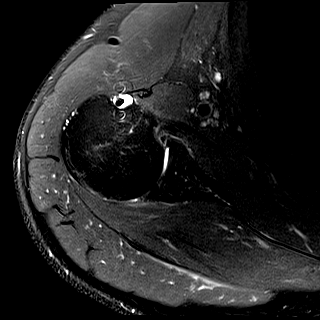
[im 17/30]
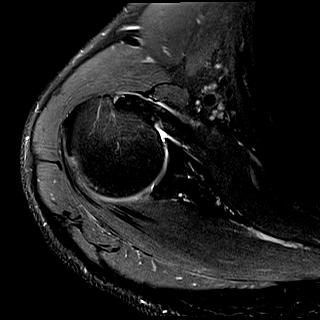
[im 21/30]
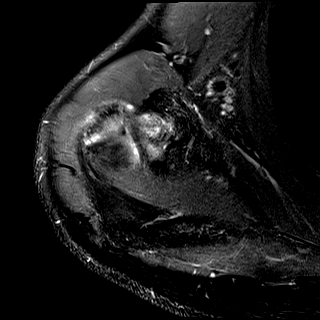
[im 25/30]
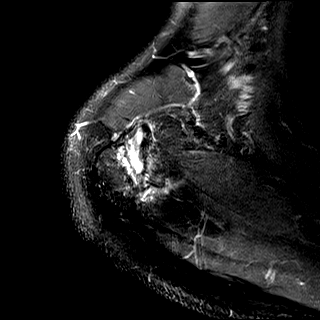
[im 30/30]
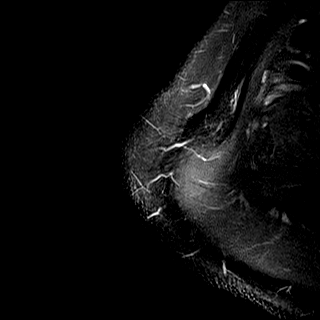

[Series 7: T2 fat-sat · oblique · right · 4.0mm · 0.22mm/px · 3 of 21 slices shown (2 of 2)]
[im 5/21]
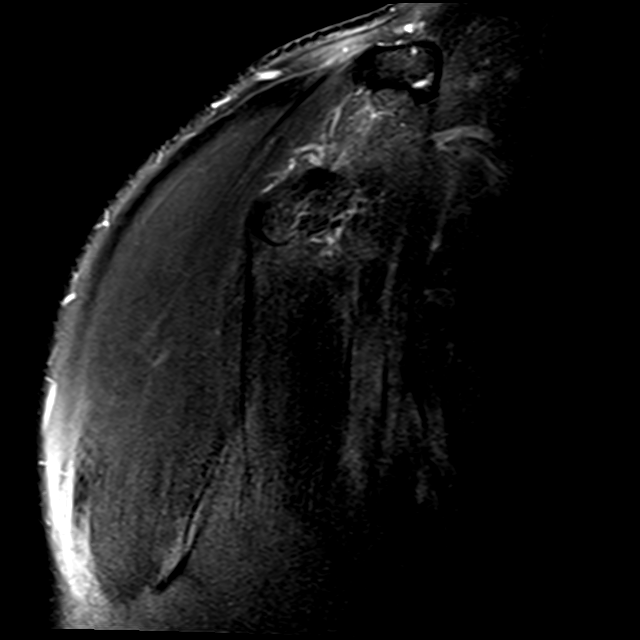
[im 13/21]
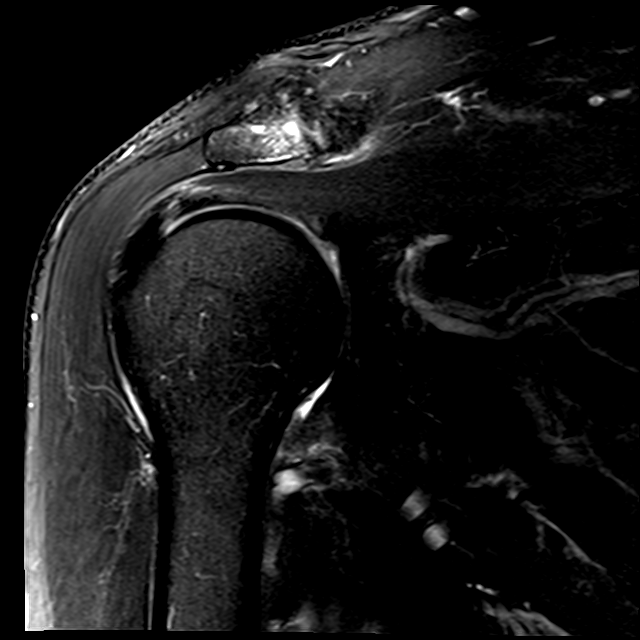
[im 21/21]
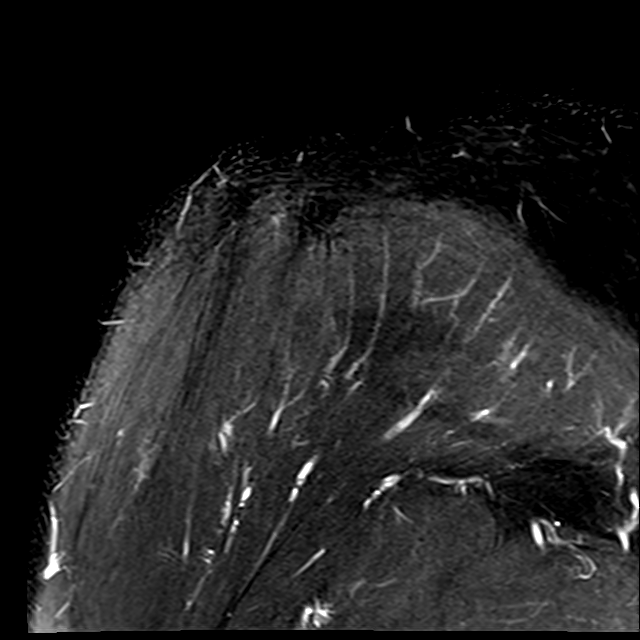

[Series 8: PD · oblique · right · 4.0mm · 0.22mm/px · 6 of 21 slices shown (1 of 2)]
[im 1/21]
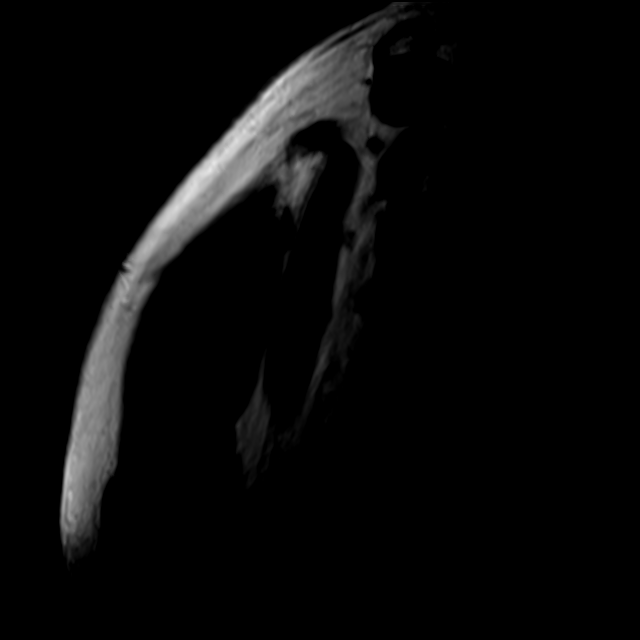
[im 5/21]
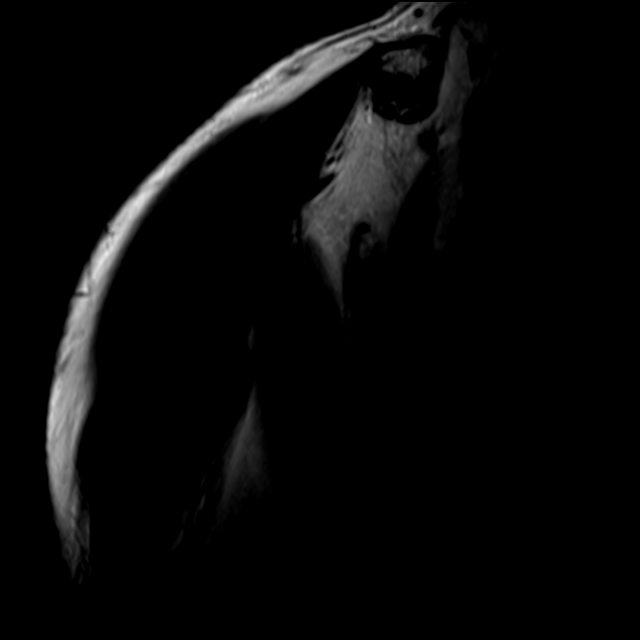
[im 9/21]
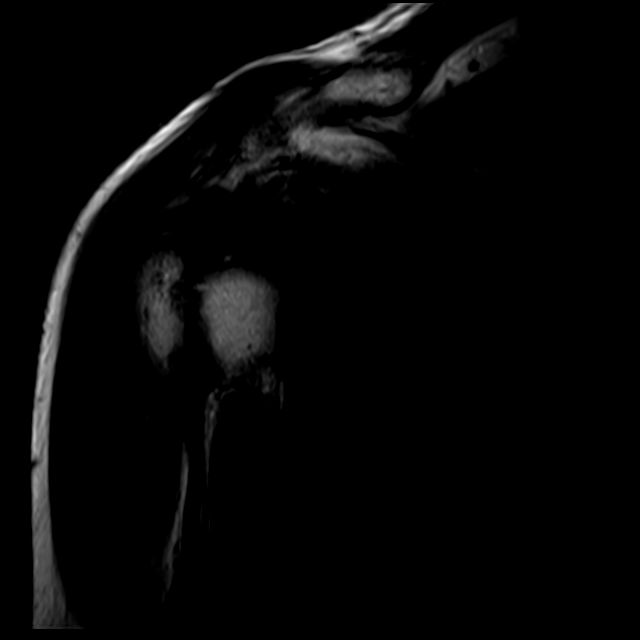
[im 13/21]
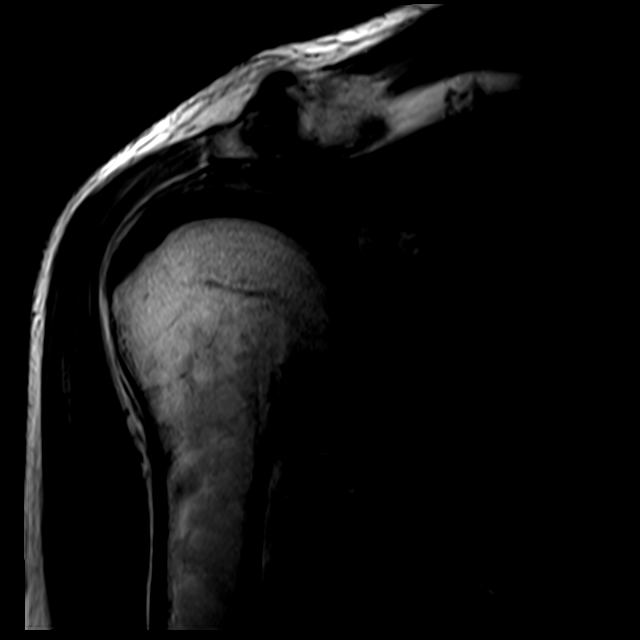
[im 17/21]
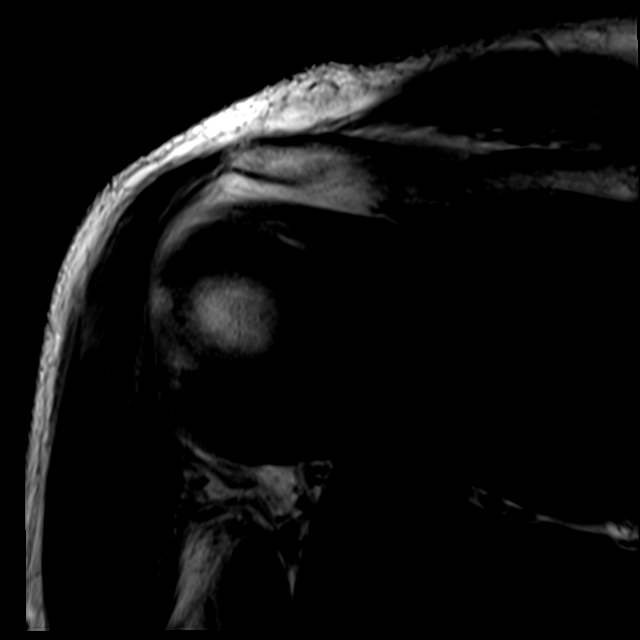
[im 21/21]
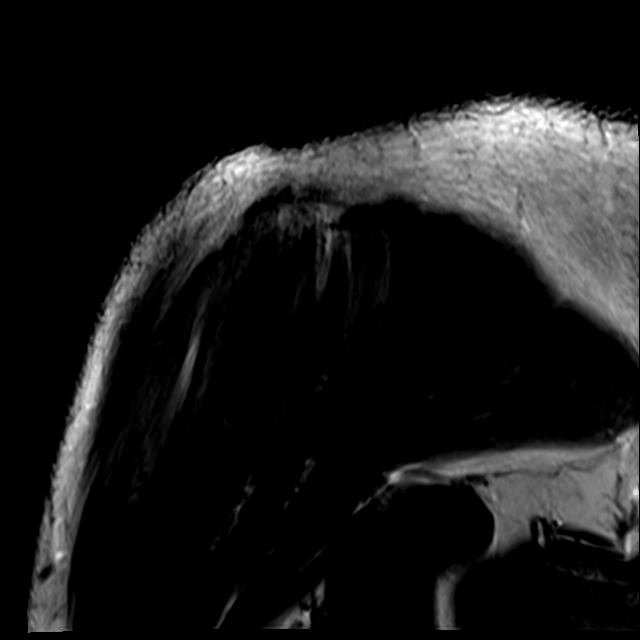

[Series 11: PD · oblique · right · 4.0mm · 0.55mm/px · 6 of 21 slices shown (2 of 2)]
[im 1/21]
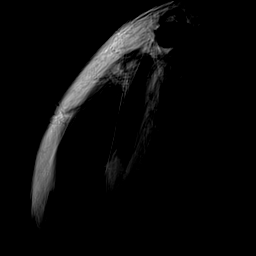
[im 5/21]
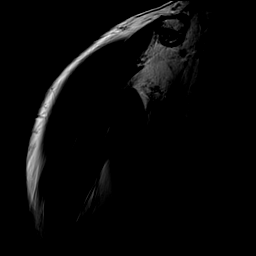
[im 9/21]
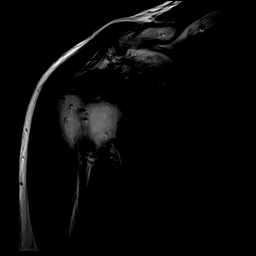
[im 13/21]
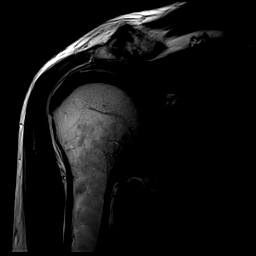
[im 17/21]
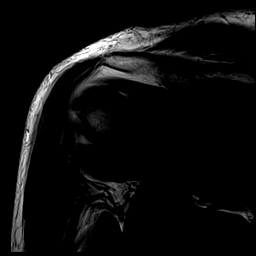
[im 21/21]
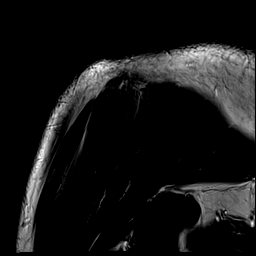

[23 of 40 positions shown; findings below may reference images not displayed]

FINDINGS: Rotator cuff: Supraspinatus tendinosis with a 4 mm partial-thickness
rim rent tear at the anterior insertion. Additional
partial-thickness interstitial tear located more posteriorly. Mild
subscapularis tendinosis. Infraspinatus and teres minor tendons
within normal limits.

Muscles: Preserved bulk and signal intensity of the rotator cuff
musculature without edema, atrophy, or fatty infiltration.

Biceps long head:  Mild intra-articular biceps tendinosis.

Acromioclavicular Joint: Moderate degenerative changes of the AC
joint. Small AC joint effusion. Trace subacromial-subdeltoid bursal
fluid.

Glenohumeral Joint: No joint effusion. Mild chondral thinning
without focal defect. Thickening of the inferior glenohumeral
ligament with mild pericapsular edema.

Labrum: Labrum appears degenerated. Evaluation limited in the
absence of intra-articular contrast. No paralabral cyst.

Bones: No acute fracture. No dislocation. No bone marrow edema. No
marrow replacing bone lesion.

Other: None.
IMPRESSION: 1. Supraspinatus tendinosis with a 4 mm partial-thickness rim rent
tear at the anterior insertion. Additional partial-thickness
interstitial tear located more posteriorly.
2. Mild intra-articular biceps tendinosis.
3. Thickening of the inferior glenohumeral ligament with mild
pericapsular edema. Findings can be seen in the clinical setting of
adhesive capsulitis.
4. Moderate AC joint osteoarthritis.

## 2022-07-26 DIAGNOSIS — Z01810 Encounter for preprocedural cardiovascular examination: Secondary | ICD-10-CM | POA: Diagnosis not present

## 2022-07-26 DIAGNOSIS — I2510 Atherosclerotic heart disease of native coronary artery without angina pectoris: Secondary | ICD-10-CM | POA: Diagnosis not present

## 2022-07-27 LAB — BASIC METABOLIC PANEL
BUN/Creatinine Ratio: 14 (ref 10–24)
BUN: 11 mg/dL (ref 8–27)
CO2: 25 mmol/L (ref 20–29)
Calcium: 9.6 mg/dL (ref 8.6–10.2)
Chloride: 101 mmol/L (ref 96–106)
Creatinine, Ser: 0.79 mg/dL (ref 0.76–1.27)
Glucose: 169 mg/dL — ABNORMAL HIGH (ref 70–99)
Potassium: 4.1 mmol/L (ref 3.5–5.2)
Sodium: 141 mmol/L (ref 134–144)
eGFR: 100 mL/min/{1.73_m2} (ref >59)

## 2022-07-28 ENCOUNTER — Ambulatory Visit (HOSPITAL_BASED_OUTPATIENT_CLINIC_OR_DEPARTMENT_OTHER)
Admission: RE | Admit: 2022-07-28 | Discharge: 2022-07-28 | Disposition: A | Discharge status: Home / Self Care | Payer: BC Managed Care – PPO | Source: Ambulatory Visit | Attending: Cardiovascular Disease | Admitting: Cardiovascular Disease

## 2022-07-28 ENCOUNTER — Other Ambulatory Visit: Payer: Self-pay | Admitting: Cardiovascular Disease

## 2022-07-28 ENCOUNTER — Ambulatory Visit (HOSPITAL_COMMUNITY)
Admission: RE | Admit: 2022-07-28 | Discharge: 2022-07-28 | Disposition: A | Discharge status: Home / Self Care | Payer: BC Managed Care – PPO | Source: Ambulatory Visit | Attending: Internal Medicine | Admitting: Internal Medicine

## 2022-07-28 DIAGNOSIS — I2510 Atherosclerotic heart disease of native coronary artery without angina pectoris: Secondary | ICD-10-CM | POA: Insufficient documentation

## 2022-07-28 DIAGNOSIS — R931 Abnormal findings on diagnostic imaging of heart and coronary circulation: Secondary | ICD-10-CM | POA: Diagnosis not present

## 2022-07-28 MED ORDER — IOHEXOL 350 MG/ML SOLN
100.0000 mL | Freq: Once | INTRAVENOUS | Status: AC | PRN
  Administered 2022-07-28: 100 mL via INTRAVENOUS

## 2022-07-28 MED ORDER — NITROGLYCERIN 0.4 MG SL SUBL
0.8000 mg | SUBLINGUAL_TABLET | Freq: Once | SUBLINGUAL | Status: AC
  Administered 2022-07-28: 0.8 mg via SUBLINGUAL

## 2022-07-28 MED ORDER — NITROGLYCERIN 0.4 MG SL SUBL
SUBLINGUAL_TABLET | SUBLINGUAL | Status: AC
  Filled 2022-07-28: qty 2

## 2022-07-31 ENCOUNTER — Telehealth: Payer: Self-pay

## 2022-07-31 NOTE — Telephone Encounter (Signed)
Pt called in wanting to discuss the results from an MRI. Pt states please feel free to lvm on answering machine if pt is not able to answer. Please advise.  Cb#: [phone]

## 2022-08-01 ENCOUNTER — Telehealth: Payer: Self-pay | Admitting: *Deleted

## 2022-08-01 NOTE — Telephone Encounter (Signed)
Collin Chris, LPN 07/10/8117  1:47 AM EDT Back to Top    Notified, copy to pcp.   Collin Chris, LPN 11/30/5619  3:08 AM EDT     No answer.   [name], MD 07/28/2022  1:50 PM EDT     Coronary calcium score is 1940 (more than 400 is considered to be significant) but the blockages in his heart vessels did not seem to affect the heart muscle yet. Continue aspirin 81 mg once daily and high intensity statin, atorvastatin 80 mg nightly. He did not have any symptoms of angina or DOE during the last clinic visit with me. Schedule cardiology clinic follow-up as needed. But if he develops any symptoms of angina or DOE, he will need to return back to [place].

## 2022-08-04 ENCOUNTER — Ambulatory Visit: Payer: BC Managed Care – PPO | Admitting: Internal Medicine

## 2022-08-22 ENCOUNTER — Other Ambulatory Visit: Payer: Self-pay | Admitting: Internal Medicine

## 2022-08-23 ENCOUNTER — Telehealth: Payer: Self-pay

## 2022-08-23 NOTE — Telephone Encounter (Signed)
Prescription Request  08/23/2022  LOV: 05/16/22  What is the name of the medication or equipment? metFORMIN (GLUCOPHAGE) 500 MG tablet [[id number]]  Have you contacted your pharmacy to request a refill? Yes   Which pharmacy would you like this sent to?  [facility] [phone] - [place], [place] - [address] AT [facility] OF [address] [place] [address] Phone: [phone] Fax: [phone]    Patient notified that their request is being sent to the clinical staff for review and that they should receive a response within 2 business days.   Please advise at [facility] [phone]

## 2022-08-23 NOTE — Telephone Encounter (Signed)
Pt called in to let office know that he does not need a refill of metFORMIN (GLUCOPHAGE) 500 MG. Pt stated that the pharmacy called in error to request this med. Please disregard the refill request.  Cb#: [phone]

## 2022-10-01 ENCOUNTER — Other Ambulatory Visit: Payer: Self-pay | Admitting: Family Medicine

## 2022-10-02 ENCOUNTER — Other Ambulatory Visit: Payer: Self-pay | Admitting: Family Medicine

## 2022-10-20 ENCOUNTER — Other Ambulatory Visit: Payer: Self-pay

## 2022-10-20 ENCOUNTER — Telehealth: Payer: Self-pay | Admitting: Family Medicine

## 2022-10-20 DIAGNOSIS — I10 Essential (primary) hypertension: Secondary | ICD-10-CM

## 2022-10-20 MED ORDER — LISINOPRIL-HYDROCHLOROTHIAZIDE 20-12.5 MG PO TABS
1.0000 | ORAL_TABLET | Freq: Every day | ORAL | 1 refills | Status: DC
Start: 2022-10-20 — End: 2023-08-06

## 2022-10-20 NOTE — Telephone Encounter (Signed)
Prescription Request  10/20/2022  LOV: 05/16/2022  What is the name of the medication or equipment?   lisinopril-hydrochlorothiazide (ZESTORETIC) 20-12.5 MG tablet  **Last dose 2 days ago.** **90 day script requested**  Have you contacted your pharmacy to request a refill? Yes   Which pharmacy would you like this sent to?  [facility] [phone] - [place], [place] - [address] AT [facility] [address] [place] [address] Phone: [phone] Fax: [phone]    Patient notified that their request is being sent to the clinical staff for review and that they should receive a response within 2 business days.   Please advise patient with refill status at [phone].

## 2022-11-08 ENCOUNTER — Inpatient Hospital Stay: Payer: BC Managed Care – PPO | Admitting: Hematology

## 2022-11-08 ENCOUNTER — Inpatient Hospital Stay: Payer: BC Managed Care – PPO

## 2022-11-23 ENCOUNTER — Telehealth: Payer: Self-pay | Admitting: Family Medicine

## 2022-11-23 NOTE — Telephone Encounter (Signed)
Patient called to follow up on PA needed for refill of pioglitazone (ACTOS) 30 MG tablet   Patient requesting 90 day supply.  Pharmacy confirmed as   [facility] [phone] - [place], [place] - [address] AT [facility] OF [address], [place] [address] Phone: [phone]  Fax: [phone]   Please advise patient at [phone]

## 2022-11-24 ENCOUNTER — Inpatient Hospital Stay: Payer: BC Managed Care – PPO | Attending: Hematology

## 2022-11-24 ENCOUNTER — Inpatient Hospital Stay: Payer: BC Managed Care – PPO | Admitting: Oncology

## 2022-11-24 DIAGNOSIS — G4730 Sleep apnea, unspecified: Secondary | ICD-10-CM | POA: Insufficient documentation

## 2022-11-24 DIAGNOSIS — F17210 Nicotine dependence, cigarettes, uncomplicated: Secondary | ICD-10-CM | POA: Insufficient documentation

## 2022-11-24 DIAGNOSIS — D751 Secondary polycythemia: Secondary | ICD-10-CM

## 2022-11-24 DIAGNOSIS — Z87891 Personal history of nicotine dependence: Secondary | ICD-10-CM | POA: Diagnosis not present

## 2022-11-24 LAB — CBC WITH DIFFERENTIAL/PLATELET
Abs Immature Granulocytes: 0.05 10*3/uL (ref 0.00–0.07)
Basophils Absolute: 0.1 10*3/uL (ref 0.0–0.1)
Basophils Relative: 1 %
Eosinophils Absolute: 0.1 10*3/uL (ref 0.0–0.5)
Eosinophils Relative: 1 %
HCT: 54 % — ABNORMAL HIGH (ref 39.0–52.0)
Hemoglobin: 18.1 g/dL — ABNORMAL HIGH (ref 13.0–17.0)
Immature Granulocytes: 0 %
Lymphocytes Relative: 21 %
Lymphs Abs: 2.4 10*3/uL (ref 0.7–4.0)
MCH: 30.9 pg (ref 26.0–34.0)
MCHC: 33.5 g/dL (ref 30.0–36.0)
MCV: 92.2 fL (ref 80.0–100.0)
Monocytes Absolute: 1.1 10*3/uL — ABNORMAL HIGH (ref 0.1–1.0)
Monocytes Relative: 10 %
Neutro Abs: 7.6 10*3/uL (ref 1.7–7.7)
Neutrophils Relative %: 67 %
Platelets: 230 10*3/uL (ref 150–400)
RBC: 5.86 MIL/uL — ABNORMAL HIGH (ref 4.22–5.81)
RDW: 13.7 % (ref 11.5–15.5)
WBC: 11.4 10*3/uL — ABNORMAL HIGH (ref 4.0–10.5)
nRBC: 0 % (ref 0.0–0.2)

## 2022-11-24 NOTE — Progress Notes (Signed)
Patient Care Team: Donita Brooks, MD as PCP - General (Family Medicine) Mallipeddi, Orion Modest, MD as PCP - Cardiology (Cardiology) Doreatha Massed, MD as Medical Oncologist (Hematology)  CHIEF COMPLAINTS/PURPOSE OF CONSULTATION:  Erythrocytosis  HISTORY OF PRESENTING ILLNESS:  Collin Farmer 62 y.o. male is seen for follow-up for polycythemia.  He met with Dr. Ellin Saba for initial consultation on 05/10/2022 when he was sent over by his PCP. Previously tested for myeloproliferative neoplasms and were negative.  He was not tested for MPL and CALR which are less likely to cause polycythemia.  Elevated hemoglobin likely secondary to smoking and they discussed donating blood every 3 to 4 months.  His last blood donation was a few months ago and believes he is due soon.  Reports he was hospitalized for DKA in December 2023 and was started on SSI.  He also had developed some dysphagia and was drinking a lot of Powerade's which he thinks contributed to his increased blood sugar.  At that time, lab work showed an elevated hemoglobin and he was referred to hematology for workup.  He had a sleep apnea testing done in 2019 which was negative.  He reports energy levels are 100%.  Denies any fatigue or headaches.  Denies any aquagenic pruritus.  No vasomotor symptoms.  No DVT, MI or CVA.  MEDICAL HISTORY:  Past Medical History:  Diagnosis Date   Diabetes mellitus type 2 in nonobese (HCC)    History of kidney stones    HLD (hyperlipidemia)    Hypertension    Polycythemia    Smoking     SURGICAL HISTORY: Past Surgical History:  Procedure Laterality Date   NO PAST SURGERIES      SOCIAL HISTORY: Social History   Socioeconomic History   Marital status: Single    Spouse name: Not on file   Number of children: Not on file   Years of education: Not on file   Highest education level: Not on file  Occupational History   Not on file  Tobacco Use   Smoking status: Every Day    Current  packs/day: 0.50    Average packs/day: 0.5 packs/day for 37.0 years (18.5 ttl pk-yrs)    Types: Cigarettes   Smokeless tobacco: Never  Substance and Sexual Activity   Alcohol use: No    Alcohol/week: 0.0 standard drinks of alcohol   Drug use: No   Sexual activity: Never    Birth control/protection: Abstinence  Other Topics Concern   Not on file  Social History Narrative   Not on file   Social Determinants of Health   Financial Resource Strain: Not on file  Food Insecurity: No Food Insecurity (04/07/2022)   Hunger Vital Sign    Worried About Running Out of Food in the Last Year: Never true    Ran Out of Food in the Last Year: Never true  Transportation Needs: No Transportation Needs (04/07/2022)   PRAPARE - Administrator, Civil Service (Medical): No    Lack of Transportation (Non-Medical): No  Physical Activity: Not on file  Stress: Not on file  Social Connections: Unknown (03/26/2022)   [place] [facility]   Social Connections    Social Connections: Not on file    Social Connections: Not on file  Intimate Partner Violence: Not At Risk (04/07/2022)   Humiliation, Afraid, Rape, and Kick questionnaire    Fear of Current or Ex-Partner: No    Emotionally Abused: No    Physically Abused: No  Sexually Abused: No    FAMILY HISTORY: Family History  Problem Relation Age of Onset   GI Bleed Mother    CAD Mother    Heart attack Father    COPD Father    Prostate cancer Father        possibly   Diabetes Sister    Heart attack Paternal Grandmother    Diabetes Paternal Grandfather    Colon cancer Neg Hx    Colon polyps Neg Hx     ALLERGIES:  is allergic to bee venom and other.  MEDICATIONS:  Current Outpatient Medications  Medication Sig Dispense Refill   aspirin 81 MG tablet Take 81 mg by mouth daily.     atorvastatin (LIPITOR) 80 MG tablet Take 1 tablet (80 mg total) by mouth daily. 90 tablet 3   buPROPion (WELLBUTRIN XL) 150 MG 24 hr tablet Take 1  tablet (150 mg total) by mouth daily. 30 tablet 11   Continuous Blood Gluc Receiver (DEXCOM G7 RECEIVER) DEVI See admin instructions.     Continuous Blood Gluc Sensor (DEXCOM G7 SENSOR) MISC 1 Device by Does not apply route every 14 (fourteen) days. 3 each 11   dapagliflozin propanediol (FARXIGA) 10 MG TABS tablet Take 1 tablet (10 mg total) by mouth daily. 90 tablet 3   glipiZIDE (GLUCOTROL XL) 5 MG 24 hr tablet Take 1 tablet (5 mg total) by mouth every evening. 90 tablet 1   Insulin Pen Needle (BD PEN NEEDLE MICRO U/F) 32G X 6 MM MISC Pt to use with Semglee pen to administer insulin daily. 30 each 1   lisinopril-hydrochlorothiazide (ZESTORETIC) 20-12.5 MG tablet Take 1 tablet by mouth daily. 90 tablet 1   metFORMIN (GLUCOPHAGE) 500 MG tablet Take 2 tablets (1,000 mg total) by mouth 2 (two) times daily with a meal. 180 tablet 1   pioglitazone (ACTOS) 30 MG tablet TAKE 1 TABLET(30 MG) BY MOUTH DAILY 30 tablet 0   metoprolol tartrate (LOPRESSOR) 100 MG tablet Take 1 tablet (100 mg total) by mouth once for 1 dose. 1 tablet 0   No current facility-administered medications for this visit.    REVIEW OF SYSTEMS:   Review of Systems  Constitutional: Negative.   All other systems reviewed and are negative.  PHYSICAL EXAMINATION: Physical Exam Vitals reviewed.  Constitutional:      Appearance: Normal appearance.  Cardiovascular:     Rate and Rhythm: Normal rate and regular rhythm.  Pulmonary:     Effort: Pulmonary effort is normal.     Breath sounds: Normal breath sounds.  Abdominal:     General: Bowel sounds are normal.     Palpations: Abdomen is soft.  Musculoskeletal:        General: No swelling. Normal range of motion.  Neurological:     Mental Status: He is alert and oriented to person, place, and time. Mental status is at baseline.     ECOG PERFORMANCE STATUS: 0 - Asymptomatic  There were no vitals filed for this visit.  There were no vitals filed for this  visit.   LABORATORY DATA:  I have reviewed the data as listed Recent Results (from the past 2160 hour(s))  CBC with Differential     Status: Abnormal   Collection Time: 11/24/22  9:34 AM  Result Value Ref Range   WBC 11.4 (H) 4.0 - 10.5 K/uL   RBC 5.86 (H) 4.22 - 5.81 MIL/uL   Hemoglobin 18.1 (H) 13.0 - 17.0 g/dL   HCT 16.1 (H) 09.6 -  52.0 %   MCV 92.2 80.0 - 100.0 fL   MCH 30.9 26.0 - 34.0 pg   MCHC 33.5 30.0 - 36.0 g/dL   RDW 56.2 13.0 - 86.5 %   Platelets 230 150 - 400 K/uL   nRBC 0.0 0.0 - 0.2 %   Neutrophils Relative % 67 %   Neutro Abs 7.6 1.7 - 7.7 K/uL   Lymphocytes Relative 21 %   Lymphs Abs 2.4 0.7 - 4.0 K/uL   Monocytes Relative 10 %   Monocytes Absolute 1.1 (H) 0.1 - 1.0 K/uL   Eosinophils Relative 1 %   Eosinophils Absolute 0.1 0.0 - 0.5 K/uL   Basophils Relative 1 %   Basophils Absolute 0.1 0.0 - 0.1 K/uL   Immature Granulocytes 0 %   Abs Immature Granulocytes 0.05 0.00 - 0.07 K/uL    Comment: Performed at [facility], [address] [place], [place] [address]    RADIOGRAPHIC STUDIES: I have personally reviewed the radiological images as listed and agreed with the findings in the report. No results found.  ASSESSMENT:  1.  Erythrocytosis: - Patient seen at the request of Dr. Tanya Nones. - CBC (03/06/2022): WBC-12.4, Hb-19.4, HCT-56, RBC-6.39, PLT-215, ANC-8.6 - Review of labs show elevated hemoglobin ranging between 17.7-19.5 since June 2015.  WBC intermittently elevated since 2019.  Platelet count normal. - Sleep study (01/11/2018): No significant obstructive or central sleep disorder with the exception of snoring and supine sleep apnea. - 10/26/2017: BCR/ABL by PCR negative.  JAK2 V617F, exon 12 negative.  Hemochromatosis panel negative. - No aquagenic pruritus/vasomotor symptoms/thrombosis  2.  Social/family history: - Lives at home with his wife.  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 - Current active smoker, 1  pack/day for 45 years. - Mother had to have phlebotomies later in her life.  No family history of malignancies.   PLAN:  1.  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 -Previously tested for myeloproliferative neoplasms which was negative.  He was not tested for MPL and CALR which are less likely to cause polycythemia. -Erythropoietin level was WNL.  -Labs from 11/24/2022 show hemoglobin of 18.1 and hematocrit of 54.0.  White blood cell count 11.4 and absolute monocyte count 1.1. -He is due for blood donation in the next few weeks.  Recommend he continue every 3 to 6-month blood donation and return to clinic in 6 months for follow-up with labs.  2.  Smoking history: - Had low-dose CT scan on 05/05/2022 which was read as lung RADS 1 negative.  Recommend repeat in 1 year.   PLAN SUMMARY: >> Continue blood donations every 3 months.  He is due soon. >> Follow-up in 6 months with labs same day (CBC/D) and see provider. >> Low-dose CT scan in February 2025.  Will get this scheduled today.     I spent 25 minutes dedicated to the care of this patient (face-to-face and non-face-to-face) on the date of the encounter to include what is described in the assessment and plan.    All questions were answered. The patient knows to call the clinic with any problems, questions or concerns.     Mauro Kaufmann, NP 11/24/22 10:57 AM

## 2022-11-29 ENCOUNTER — Telehealth: Payer: Self-pay

## 2022-11-29 NOTE — Telephone Encounter (Signed)
PA for Actos sent to Cover My Meds. Mjp,lpn

## 2022-12-06 ENCOUNTER — Other Ambulatory Visit: Payer: Self-pay

## 2022-12-06 DIAGNOSIS — E119 Type 2 diabetes mellitus without complications: Secondary | ICD-10-CM

## 2022-12-06 DIAGNOSIS — I2510 Atherosclerotic heart disease of native coronary artery without angina pectoris: Secondary | ICD-10-CM

## 2022-12-06 MED ORDER — PIOGLITAZONE HCL 30 MG PO TABS
30.0000 mg | ORAL_TABLET | Freq: Every day | ORAL | 1 refills | Status: DC
Start: 2022-12-06 — End: 2023-10-17

## 2022-12-06 NOTE — Telephone Encounter (Signed)
Patient left voicemail to follow up on refill requested for pioglitazone (ACTOS) 30 MG tablet   Last dose taken about 2 weeks ago. Requesting call back.   Please advise at [phone]

## 2023-01-16 ENCOUNTER — Other Ambulatory Visit: Payer: Self-pay | Admitting: Family Medicine

## 2023-01-16 DIAGNOSIS — E119 Type 2 diabetes mellitus without complications: Secondary | ICD-10-CM

## 2023-01-16 NOTE — Telephone Encounter (Signed)
Requested medication (s) are due for refill today: yes  Requested medication (s) are on the active medication list: yes  Last refill:  07/17/22 #90/1  Future visit scheduled: yes 01/26/23  Notes to clinic:  Unable to refill per protocol due to failed labs, no updated results.     Requested Prescriptions  Pending Prescriptions Disp Refills   glipiZIDE (GLUCOTROL XL) 5 MG 24 hr tablet [Pharmacy Med Name: GLIPIZIDE ER 5MG  TABLETS] 90 tablet 1    Sig: TAKE 1 TABLET(5 MG) BY MOUTH EVERY EVENING     Endocrinology:  Diabetes - Sulfonylureas Failed - 01/16/2023 11:19 AM      Failed - HBA1C is between 0 and 7.9 and within 180 days    Hgb A1c MFr Bld  Date Value Ref Range Status  03/06/2022 >14.0 (H) <5.7 % of total Hgb Final    Comment:    Verified by repeat analysis. . For someone without known diabetes, a hemoglobin A1c value of 6.5% or greater indicates that they may have  diabetes and this should be confirmed with a follow-up  test. . For someone with known diabetes, a value <7% indicates  that their diabetes is well controlled and a value  greater than or equal to 7% indicates suboptimal  control. A1c targets should be individualized based on  duration of diabetes, age, comorbid conditions, and  other considerations. . Currently, no consensus exists regarding use of hemoglobin A1c for diagnosis of diabetes for children. .          Failed - Valid encounter within last 6 months    Recent Outpatient Visits           1 year ago Diabetes mellitus type 2 in nonobese [facility])   [facility] Donita Brooks, MD   2 years ago Diabetes mellitus type 2 in nonobese [facility])   [facility] Pickard, Priscille Heidelberg, MD   2 years ago Pure hypercholesterolemia   [facility] Pickard, Priscille Heidelberg, MD   3 years ago Encounter for screening for lung cancer   [facility] Pickard, Priscille Heidelberg, MD   4 years ago Essential hypertension    [facility] Pickard, Priscille Heidelberg, MD       Future Appointments             In 1 week Pickard, Priscille Heidelberg, MD [facility]            Passed - Cr in normal range and within 360 days    Creat  Date Value Ref Range Status  03/06/2022 0.87 0.70 - 1.35 mg/dL Final   Creatinine, Ser  Date Value Ref Range Status  07/26/2022 0.79 0.76 - 1.27 mg/dL Final

## 2023-01-25 ENCOUNTER — Other Ambulatory Visit: Payer: Self-pay

## 2023-01-25 ENCOUNTER — Telehealth: Payer: Self-pay

## 2023-01-25 DIAGNOSIS — E119 Type 2 diabetes mellitus without complications: Secondary | ICD-10-CM

## 2023-01-25 MED ORDER — GLIPIZIDE ER 5 MG PO TB24
5.0000 mg | ORAL_TABLET | Freq: Every evening | ORAL | 1 refills | Status: DC
Start: 2023-01-25 — End: 2023-10-03

## 2023-01-25 NOTE — Telephone Encounter (Signed)
Pt called in to request a refill of this med glipiZIDE (GLUCOTROL XL) 5 MG 24 hr tablet [[id number]].   LOV: 05/16/22 UPCOMING F/U APPT 02/02/23  PHARMACY: [facility] [phone] - [place], [place] - [address] AT [facility] [address], [place] [address] Phone: [phone]  Fax: [phone] DEA #: [id number]    CB#: [phone]

## 2023-01-26 ENCOUNTER — Ambulatory Visit: Payer: BC Managed Care – PPO | Admitting: Family Medicine

## 2023-02-02 ENCOUNTER — Ambulatory Visit: Payer: BC Managed Care – PPO | Admitting: Family Medicine

## 2023-02-02 ENCOUNTER — Encounter: Payer: Self-pay | Admitting: Family Medicine

## 2023-02-02 VITALS — BP 120/70 | HR 69 | Temp 98.7°F | Ht 68.0 in | Wt 188.6 lb

## 2023-02-02 DIAGNOSIS — I2583 Coronary atherosclerosis due to lipid rich plaque: Secondary | ICD-10-CM | POA: Diagnosis not present

## 2023-02-02 DIAGNOSIS — I10 Essential (primary) hypertension: Secondary | ICD-10-CM | POA: Diagnosis not present

## 2023-02-02 DIAGNOSIS — I2510 Atherosclerotic heart disease of native coronary artery without angina pectoris: Secondary | ICD-10-CM

## 2023-02-02 DIAGNOSIS — Z7984 Long term (current) use of oral hypoglycemic drugs: Secondary | ICD-10-CM

## 2023-02-02 DIAGNOSIS — E119 Type 2 diabetes mellitus without complications: Secondary | ICD-10-CM | POA: Diagnosis not present

## 2023-02-02 NOTE — Progress Notes (Signed)
Subjective:    Patient ID: Collin Farmer, male    DOB: July 03, 1960, 62 y.o.   MRN: [id number] Patient is here today to follow-up 80s.  Diabetic foot exam was performed today and is normal.  Earlier this year, the patient had a CT scan of his heart which showed a near 70% blockage in his right coronary artery and up to a 50% blockage in his LAD and circumflex.  He denies any chest pain or shortness of breath.  Unfortunately continues to smoke.  His blood pressure today is well-controlled.  He is due to recheck his hemoglobin A1c along with his cholesterol.  He is taking an aspirin.  He is consistently taking his atorvastatin.  He is due for shingles vaccine which she would like to get today.  He politely declines a flu shot. Past Medical History:  Diagnosis Date   Diabetes mellitus type 2 in nonobese [facility])    History of kidney stones    HLD (hyperlipidemia)    Hypertension    Polycythemia    Smoking    Current Outpatient Medications on File Prior to Visit  Medication Sig Dispense Refill   aspirin 81 MG tablet Take 81 mg by mouth daily.     atorvastatin (LIPITOR) 80 MG tablet Take 1 tablet (80 mg total) by mouth daily. 90 tablet 3   buPROPion (WELLBUTRIN XL) 150 MG 24 hr tablet Take 1 tablet (150 mg total) by mouth daily. 30 tablet 11   dapagliflozin propanediol (FARXIGA) 10 MG TABS tablet Take 1 tablet (10 mg total) by mouth daily. 90 tablet 3   glipiZIDE (GLUCOTROL XL) 5 MG 24 hr tablet Take 1 tablet (5 mg total) by mouth every evening. 90 tablet 1   lisinopril-hydrochlorothiazide (ZESTORETIC) 20-12.5 MG tablet Take 1 tablet by mouth daily. 90 tablet 1   metFORMIN (GLUCOPHAGE) 500 MG tablet Take 2 tablets (1,000 mg total) by mouth 2 (two) times daily with a meal. 180 tablet 1   pioglitazone (ACTOS) 30 MG tablet Take 1 tablet (30 mg total) by mouth daily. 90 tablet 1   Continuous Blood Gluc Receiver (DEXCOM G7 RECEIVER) DEVI See admin instructions. (Patient not taking: Reported on  02/02/2023)     Continuous Blood Gluc Sensor (DEXCOM G7 SENSOR) MISC 1 Device by Does not apply route every 14 (fourteen) days. (Patient not taking: Reported on 02/02/2023) 3 each 11   Insulin Pen Needle (BD PEN NEEDLE MICRO U/F) 32G X 6 MM MISC Pt to use with Semglee pen to administer insulin daily. (Patient not taking: Reported on 02/02/2023) 30 each 1   No current facility-administered medications on file prior to visit.   Allergies  Allergen Reactions   Bee Venom Anaphylaxis   Other     Bee stings   Social History   Socioeconomic History   Marital status: Single    Spouse name: Not on file   Number of children: Not on file   Years of education: Not on file   Highest education level: 12th grade  Occupational History   Not on file  Tobacco Use   Smoking status: Every Day    Current packs/day: 0.50    Average packs/day: 0.5 packs/day for 37.0 years (18.5 ttl pk-yrs)    Types: Cigarettes   Smokeless tobacco: Never  Substance and Sexual Activity   Alcohol use: No    Alcohol/week: 0.0 standard drinks of alcohol   Drug use: No   Sexual activity: Never    Birth control/protection:  Abstinence  Other Topics Concern   Not on file  Social History Narrative   Not on file   Social Determinants of Health   Financial Resource Strain: Medium Risk (01/22/2023)   Overall Financial Resource Strain (CARDIA)    Difficulty of Paying Living Expenses: Somewhat hard  Food Insecurity: Food Insecurity Present (01/22/2023)   Hunger Vital Sign    Worried About Running Out of Food in the Last Year: Sometimes true    Ran Out of Food in the Last Year: Never true  Transportation Needs: No Transportation Needs (01/22/2023)   PRAPARE - Administrator, Civil Service (Medical): No    Lack of Transportation (Non-Medical): No  Physical Activity: Insufficiently Active (01/22/2023)   Exercise Vital Sign    Days of Exercise per Week: 7 days    Minutes of Exercise per Session: 20 min  Stress:  No Stress Concern Present (01/22/2023)   [employer] of Occupational Health - Occupational Stress Questionnaire    Feeling of Stress : Not at all  Social Connections: Moderately Isolated (01/22/2023)   Social Connection and Isolation Panel [NHANES]    Frequency of Communication with Friends and Family: Three times a week    Frequency of Social Gatherings with Friends and Family: Once a week    Attends Religious Services: 1 to 4 times per year    Active Member of [employer] or Organizations: No    Attends Engineer, structural: Not on file    Marital Status: Divorced  Intimate Partner Violence: Not At Risk (04/07/2022)   Humiliation, Afraid, Rape, and Kick questionnaire    Fear of Current or Ex-Partner: No    Emotionally Abused: No    Physically Abused: No    Sexually Abused: No      Review of Systems  All other systems reviewed and are negative.      Objective:   Physical Exam Vitals reviewed.  Constitutional:      General: He is not in acute distress.    Appearance: He is well-developed. He is not diaphoretic.  HENT:     Head: Normocephalic and atraumatic.  Eyes:     General: No scleral icterus.    Conjunctiva/sclera: Conjunctivae normal.  Neck:     Thyroid: No thyromegaly.     Vascular: No JVD.  Cardiovascular:     Rate and Rhythm: Normal rate and regular rhythm.     Heart sounds: Normal heart sounds. No murmur heard.    No friction rub. No gallop.  Pulmonary:     Effort: Pulmonary effort is normal. No respiratory distress.     Breath sounds: Normal breath sounds. No wheezing or rales.  Chest:     Chest wall: No tenderness.  Abdominal:     General: Bowel sounds are normal. There is no distension.     Palpations: Abdomen is soft. There is no mass.     Tenderness: There is no abdominal tenderness. There is no guarding or rebound.  Musculoskeletal:     Cervical back: Neck supple.  Lymphadenopathy:     Cervical: No cervical adenopathy.            Assessment & Plan:  Diabetes mellitus type 2 in nonobese (HCC) - Plan: Hemoglobin A1c, CBC with Differential/Platelet, COMPLETE METABOLIC PANEL WITH GFR, Lipid panel, Protein / Creatinine Ratio, Urine  Coronary artery disease due to lipid rich plaque  Essential hypertension I am happy with his blood pressure.  I will check his cholesterol today.  I  would like to see his LDL cholesterol less than 55.  Continue to encourage him to take an aspirin.  Check his A1c.  Goal A1c is less than 55.  He declines a flu shot but he did receive the shingles vaccine today.  Strongly urged the patient to quit smoking.  We discussed strategies to help with.  He is currently in the precontemplative phase.

## 2023-02-03 LAB — COMPLETE METABOLIC PANEL WITH GFR
AG Ratio: 2.1 (calc) (ref 1.0–2.5)
ALT: 19 U/L (ref 9–46)
AST: 15 U/L (ref 10–35)
Albumin: 4.4 g/dL (ref 3.6–5.1)
Alkaline phosphatase (APISO): 85 U/L (ref 35–144)
BUN: 11 mg/dL (ref 7–25)
CO2: 30 mmol/L (ref 20–32)
Calcium: 9.9 mg/dL (ref 8.6–10.3)
Chloride: 104 mmol/L (ref 98–110)
Creat: 0.76 mg/dL (ref 0.70–1.35)
Globulin: 2.1 g/dL (ref 1.9–3.7)
Glucose, Bld: 148 mg/dL — ABNORMAL HIGH (ref 65–99)
Potassium: 4.6 mmol/L (ref 3.5–5.3)
Sodium: 142 mmol/L (ref 135–146)
Total Bilirubin: 0.7 mg/dL (ref 0.2–1.2)
Total Protein: 6.5 g/dL (ref 6.1–8.1)
eGFR: 100 mL/min/{1.73_m2} (ref >=60)

## 2023-02-03 LAB — LIPID PANEL
Cholesterol: 113 mg/dL (ref <200)
HDL: 48 mg/dL (ref >=40)
LDL Cholesterol (Calc): 52 mg/dL
Non-HDL Cholesterol (Calc): 65 mg/dL (ref <130)
Total CHOL/HDL Ratio: 2.4 (calc) (ref <5.0)
Triglycerides: 44 mg/dL (ref <150)

## 2023-02-03 LAB — CBC WITH DIFFERENTIAL/PLATELET
Absolute Lymphocytes: 1990 {cells}/uL (ref 850–3900)
Absolute Monocytes: 939 {cells}/uL (ref 200–950)
Basophils Absolute: 61 {cells}/uL (ref 0–200)
Basophils Relative: 0.6 %
Eosinophils Absolute: 101 {cells}/uL (ref 15–500)
Eosinophils Relative: 1 %
HCT: 55.5 % — ABNORMAL HIGH (ref 38.5–50.0)
Hemoglobin: 17.9 g/dL — ABNORMAL HIGH (ref 13.2–17.1)
MCH: 29.9 pg (ref 27.0–33.0)
MCHC: 32.3 g/dL (ref 32.0–36.0)
MCV: 92.7 fL (ref 80.0–100.0)
MPV: 10.7 fL (ref 7.5–12.5)
Monocytes Relative: 9.3 %
Neutro Abs: 7009 {cells}/uL (ref 1500–7800)
Neutrophils Relative %: 69.4 %
Platelets: 288 Thousand/uL (ref 140–400)
RBC: 5.99 Million/uL — ABNORMAL HIGH (ref 4.20–5.80)
RDW: 13.1 % (ref 11.0–15.0)
Total Lymphocyte: 19.7 %
WBC: 10.1 Thousand/uL (ref 3.8–10.8)

## 2023-02-03 LAB — PROTEIN / CREATININE RATIO, URINE
Creatinine, Urine: 70 mg/dL (ref 20–320)
Protein/Creat Ratio: 129 mg/g{creat} (ref 25–148)
Protein/Creatinine Ratio: 0.129 mg/mg{creat} (ref 0.025–0.148)
Total Protein, Urine: 9 mg/dL (ref 5–25)

## 2023-02-03 LAB — HEMOGLOBIN A1C
Hgb A1c MFr Bld: 7.5 %{Hb} — ABNORMAL HIGH
Mean Plasma Glucose: 169 mg/dL
eAG (mmol/L): 9.3 mmol/L

## 2023-03-06 ENCOUNTER — Other Ambulatory Visit: Payer: Self-pay | Admitting: Family Medicine

## 2023-03-06 DIAGNOSIS — E7800 Pure hypercholesterolemia, unspecified: Secondary | ICD-10-CM

## 2023-03-08 NOTE — Telephone Encounter (Signed)
Requested by interface surescripts. Medication dose discontinued 05/16/22. Requested Prescriptions  Refused Prescriptions Disp Refills   atorvastatin (LIPITOR) 20 MG tablet [Pharmacy Med Name: ATORVASTATIN 20MG  TABLETS] 90 tablet 3    Sig: TAKE 1 TABLET BY MOUTH ONCE A DAY     Cardiovascular:  Antilipid - Statins Failed - 03/06/2023  6:32 AM      Failed - Valid encounter within last 12 months    Recent Outpatient Visits           1 year ago Diabetes mellitus type 2 in nonobese [facility])   [facility] Donita Brooks, MD   2 years ago Diabetes mellitus type 2 in nonobese [facility])   [facility] Pickard, Priscille Heidelberg, MD   2 years ago Pure hypercholesterolemia   [facility] Pickard, Priscille Heidelberg, MD   3 years ago Encounter for screening for lung cancer   [facility] Pickard, Priscille Heidelberg, MD   4 years ago Essential hypertension   [facility] Tanya Nones, Priscille Heidelberg, MD              Failed - Lipid Panel in normal range within the last 12 months    Cholesterol  Date Value Ref Range Status  02/02/2023 113 <200 mg/dL Final   LDL Cholesterol (Calc)  Date Value Ref Range Status  02/02/2023 52 mg/dL (calc) Final    Comment:    Reference range: <100 . Desirable range <100 mg/dL for primary prevention;   <70 mg/dL for patients with CHD or diabetic patients  with > or = 2 CHD risk factors. Marland Kitchen LDL-C is now calculated using the Martin-Hopkins  calculation, which is a validated novel method providing  better accuracy than the Friedewald equation in the  estimation of LDL-C.  Horald Pollen et al. Lenox Ahr. 1610;960(45): 2061-2068  (http://education.[facility].com/faq/FAQ164)    HDL  Date Value Ref Range Status  02/02/2023 48 > OR = 40 mg/dL Final   Triglycerides  Date Value Ref Range Status  02/02/2023 44 <150 mg/dL Final         Passed - Patient is not pregnant

## 2023-03-20 ENCOUNTER — Other Ambulatory Visit: Payer: Self-pay

## 2023-03-20 ENCOUNTER — Telehealth: Payer: Self-pay

## 2023-03-20 DIAGNOSIS — E119 Type 2 diabetes mellitus without complications: Secondary | ICD-10-CM

## 2023-03-20 MED ORDER — METFORMIN HCL 500 MG PO TABS: 1000.0000 mg | ORAL_TABLET | Freq: Two times a day (BID) | ORAL | 1 refills | Status: DC

## 2023-03-20 NOTE — Telephone Encounter (Signed)
Copied from CRM [id number]. Topic: Clinical - Medication Refill >> Mar 20, 2023 12:46 PM Adelina Mings wrote: Most Recent Primary Care Visit:  Provider: Lynnea Ferrier T  [facility]  Visit Type: OFFICE VISIT  Date: 02/02/2023  Medication: metFORMIN (GLUCOPHAGE) 500 MG tablet   Has the patient contacted their pharmacy? Yes (Agent: If no, request that the patient contact the pharmacy for the refill. If patient does not wish to contact the pharmacy document the reason why and proceed with request.) (Agent: If yes, when and what did the pharmacy advise?)  Is this the correct pharmacy for this prescription? Yes If no, delete pharmacy and type the correct one.  This is the patient's preferred pharmacy:  [facility] [phone] - [place], [place] - [address] AT [facility] OF [address] [place] [address] Phone: [phone] Fax: [phone]   Has the prescription been filled recently? No  Is the patient out of the medication? No  Has the patient been seen for an appointment in the last year OR does the patient have an upcoming appointment? Yes  Can we respond through MyChart? No  Agent: Please be advised that Rx refills may take up to 3 business days. We ask that you follow-up with your pharmacy.

## 2023-03-30 ENCOUNTER — Telehealth: Payer: Self-pay

## 2023-03-30 NOTE — Telephone Encounter (Signed)
Diabetic eye exam on 04/20/23 at 820am

## 2023-04-06 ENCOUNTER — Ambulatory Visit: Payer: BC Managed Care – PPO

## 2023-04-20 ENCOUNTER — Ambulatory Visit: Payer: BC Managed Care – PPO

## 2023-04-27 ENCOUNTER — Ambulatory Visit (INDEPENDENT_AMBULATORY_CARE_PROVIDER_SITE_OTHER): Payer: BC Managed Care – PPO

## 2023-04-27 DIAGNOSIS — E1169 Type 2 diabetes mellitus with other specified complication: Secondary | ICD-10-CM

## 2023-04-27 DIAGNOSIS — E119 Type 2 diabetes mellitus without complications: Secondary | ICD-10-CM

## 2023-04-27 DIAGNOSIS — Z794 Long term (current) use of insulin: Secondary | ICD-10-CM

## 2023-04-27 DIAGNOSIS — Z23 Encounter for immunization: Secondary | ICD-10-CM | POA: Diagnosis not present

## 2023-04-27 LAB — HM DIABETES EYE EXAM

## 2023-04-27 NOTE — Progress Notes (Signed)
Collin Farmer arrived 04/27/2023 and has given verbal consent to obtain images and complete their overdue diabetic retinal screening.  The images have been sent to an ophthalmologist or optometrist for review and interpretation.  Results will be sent back to Collin Brooks, MD for review.  Patient has been informed they will be contacted when we receive the results via telephone or MyChart

## 2023-04-27 NOTE — Progress Notes (Signed)
Pt also rec' 2nd shingles vaccine on the L-upper arm. Pt tol inj well w/no c/o per pt.

## 2023-05-24 ENCOUNTER — Other Ambulatory Visit: Payer: Self-pay

## 2023-05-24 DIAGNOSIS — D751 Secondary polycythemia: Secondary | ICD-10-CM

## 2023-05-25 ENCOUNTER — Ambulatory Visit (HOSPITAL_COMMUNITY): Payer: BC Managed Care – PPO

## 2023-05-25 ENCOUNTER — Inpatient Hospital Stay: Payer: BC Managed Care – PPO | Attending: Family Medicine

## 2023-06-01 ENCOUNTER — Telehealth: Payer: BC Managed Care – PPO | Admitting: Oncology

## 2023-06-14 ENCOUNTER — Other Ambulatory Visit: Payer: Self-pay | Admitting: Family Medicine

## 2023-06-14 NOTE — Telephone Encounter (Signed)
 Requested Prescriptions  Pending Prescriptions Disp Refills   buPROPion (WELLBUTRIN XL) 150 MG 24 hr tablet [Pharmacy Med Name: BUPROPION XL 150MG  TABLETS (24 H)] 30 tablet 2    Sig: TAKE 1 TABLET(150 MG) BY MOUTH DAILY     Psychiatry: Antidepressants - bupropion Failed - 06/14/2023  1:55 PM      Failed - Valid encounter within last 6 months    Recent Outpatient Visits           2 years ago Diabetes mellitus type 2 in nonobese [facility])   [facility] Pickard, Priscille Heidelberg, MD   2 years ago Diabetes mellitus type 2 in nonobese [facility])   [facility] Donita Brooks, MD   2 years ago Pure hypercholesterolemia   [facility] Pickard, Priscille Heidelberg, MD   3 years ago Encounter for screening for lung cancer   [facility] Pickard, Priscille Heidelberg, MD   5 years ago Essential hypertension   [facility] Pickard, Priscille Heidelberg, MD              Passed - Cr in normal range and within 360 days    Creat  Date Value Ref Range Status  02/02/2023 0.76 0.70 - 1.35 mg/dL Final   Creatinine, Urine  Date Value Ref Range Status  02/02/2023 70 20 - 320 mg/dL Final         Passed - AST in normal range and within 360 days    AST  Date Value Ref Range Status  02/02/2023 15 10 - 35 U/L Final         Passed - ALT in normal range and within 360 days    ALT  Date Value Ref Range Status  02/02/2023 19 9 - 46 U/L Final         Passed - Last BP in normal range    BP Readings from Last 1 Encounters:  02/02/23 120/70

## 2023-06-19 ENCOUNTER — Other Ambulatory Visit: Payer: Self-pay | Admitting: Family Medicine

## 2023-06-20 NOTE — Telephone Encounter (Signed)
 Requested Prescriptions  Pending Prescriptions Disp Refills   atorvastatin (LIPITOR) 80 MG tablet [Pharmacy Med Name: ATORVASTATIN 80MG  TABLETS] 90 tablet 3    Sig: TAKE 1 TABLET(80 MG) BY MOUTH DAILY     Cardiovascular:  Antilipid - Statins Failed - 06/20/2023 11:53 AM      Failed - Valid encounter within last 12 months    Recent Outpatient Visits           2 years ago Diabetes mellitus type 2 in nonobese [facility])   [facility] Donita Brooks, MD   2 years ago Diabetes mellitus type 2 in nonobese [facility])   [facility] Pickard, Priscille Heidelberg, MD   2 years ago Pure hypercholesterolemia   [facility] Pickard, Priscille Heidelberg, MD   3 years ago Encounter for screening for lung cancer   [facility] Pickard, Priscille Heidelberg, MD   5 years ago Essential hypertension   [facility] Donita Brooks, MD              Failed - Lipid Panel in normal range within the last 12 months    Cholesterol  Date Value Ref Range Status  02/02/2023 113 <200 mg/dL Final   LDL Cholesterol (Calc)  Date Value Ref Range Status  02/02/2023 52 mg/dL (calc) Final    Comment:    Reference range: <100 . Desirable range <100 mg/dL for primary prevention;   <70 mg/dL for patients with CHD or diabetic patients  with > or = 2 CHD risk factors. Marland Kitchen LDL-C is now calculated using the Martin-Hopkins  calculation, which is a validated novel method providing  better accuracy than the Friedewald equation in the  estimation of LDL-C.  Horald Pollen et al. Lenox Ahr. 4132;440(10): 2061-2068  (http://education.[facility].com/faq/FAQ164)    HDL  Date Value Ref Range Status  02/02/2023 48 > OR = 40 mg/dL Final   Triglycerides  Date Value Ref Range Status  02/02/2023 44 <150 mg/dL Final         Passed - Patient is not pregnant

## 2023-06-22 ENCOUNTER — Ambulatory Visit (HOSPITAL_COMMUNITY): Payer: BC Managed Care – PPO

## 2023-06-29 ENCOUNTER — Ambulatory Visit (HOSPITAL_COMMUNITY)
Admission: RE | Admit: 2023-06-29 | Discharge: 2023-06-29 | Disposition: A | Discharge status: Home / Self Care | Source: Ambulatory Visit | Attending: Oncology | Admitting: Oncology

## 2023-06-29 DIAGNOSIS — Z87891 Personal history of nicotine dependence: Secondary | ICD-10-CM | POA: Diagnosis not present

## 2023-06-29 DIAGNOSIS — F17210 Nicotine dependence, cigarettes, uncomplicated: Secondary | ICD-10-CM | POA: Diagnosis not present

## 2023-07-18 NOTE — Progress Notes (Signed)
 [place] report received and pt is negative for diabetic retinopathy to both eyes. Report given to PCP. Pt advised via my chart message. Mjp,lpn

## 2023-08-02 ENCOUNTER — Encounter: Payer: Self-pay | Admitting: *Deleted

## 2023-08-02 ENCOUNTER — Other Ambulatory Visit: Payer: Self-pay | Admitting: Family Medicine

## 2023-08-02 DIAGNOSIS — I10 Essential (primary) hypertension: Secondary | ICD-10-CM

## 2023-10-03 ENCOUNTER — Other Ambulatory Visit: Payer: Self-pay | Admitting: Family Medicine

## 2023-10-03 DIAGNOSIS — E119 Type 2 diabetes mellitus without complications: Secondary | ICD-10-CM

## 2023-10-17 ENCOUNTER — Other Ambulatory Visit: Payer: Self-pay | Admitting: Family Medicine

## 2023-10-17 DIAGNOSIS — I2510 Atherosclerotic heart disease of native coronary artery without angina pectoris: Secondary | ICD-10-CM

## 2023-10-17 DIAGNOSIS — E119 Type 2 diabetes mellitus without complications: Secondary | ICD-10-CM

## 2023-10-17 NOTE — Telephone Encounter (Unsigned)
 Copied from CRM [id number]. Topic: Clinical - Medication Refill >> Oct 17, 2023  2:46 PM Silvana PARAS wrote: Medication: dapagliflozin  propanediol (FARXIGA ) 10 MG TABS tablet, pioglitazone  (ACTOS ) 30 MG tablet, atorvastatin  (LIPITOR) 80 MG tablet  Has the patient contacted their pharmacy? Yes (Agent: If no, request that the patient contact the pharmacy for the refill. If patient does not wish to contact the pharmacy document the reason why and proceed with request.) (Agent: If yes, when and what did the pharmacy advise?)  This is the patient's preferred pharmacy:  [facility] [phone] - [place], [place] - [address] AT [facility] [address] [place] [address] Phone: [phone] Fax: [phone]  Is this the correct pharmacy for this prescription? Yes If no, delete pharmacy and type the correct one.   Has the prescription been filled recently? No  Is the patient out of the medication? Yes  Has the patient been seen for an appointment in the last year OR does the patient have an upcoming appointment? Yes  Can we respond through MyChart? Yes  Agent: Please be advised that Rx refills may take up to 3 business days. We ask that you follow-up with your pharmacy.

## 2023-10-19 MED ORDER — DAPAGLIFLOZIN PROPANEDIOL 10 MG PO TABS: 10.0000 mg | ORAL_TABLET | Freq: Every day | ORAL | 0 refills | Status: DC

## 2023-10-19 MED ORDER — PIOGLITAZONE HCL 30 MG PO TABS: 30.0000 mg | ORAL_TABLET | Freq: Every day | ORAL | 0 refills | Status: DC

## 2023-10-19 NOTE — Telephone Encounter (Signed)
 Requested Prescriptions  Pending Prescriptions Disp Refills   dapagliflozin  propanediol (FARXIGA ) 10 MG TABS tablet 90 tablet 0    Sig: Take 1 tablet (10 mg total) by mouth daily.     Endocrinology:  Diabetes - SGLT2 Inhibitors Failed - 10/19/2023  8:36 AM      Failed - HBA1C is between 0 and 7.9 and within 180 days    Hgb A1c MFr Bld  Date Value Ref Range Status  02/02/2023 7.5 (H) <5.7 % of total Hgb Final    Comment:    For someone without known diabetes, a hemoglobin A1c value of 6.5% or greater indicates that they may have  diabetes and this should be confirmed with a follow-up  test. . For someone with known diabetes, a value <7% indicates  that their diabetes is well controlled and a value  greater than or equal to 7% indicates suboptimal  control. A1c targets should be individualized based on  duration of diabetes, age, comorbid conditions, and  other considerations. . Currently, no consensus exists regarding use of hemoglobin A1c for diagnosis of diabetes for children. .          Failed - Valid encounter within last 6 months    Recent Outpatient Visits           8 months ago Diabetes mellitus type 2 in nonobese [facility])   [facility] Pickard, Butler DASEN, MD   1 year ago Coronary artery calcification seen on CAT scan   [facility] Duanne Butler DASEN, MD   1 year ago Diabetes mellitus type 2 in nonobese [facility])   [facility] Duanne Butler DASEN, MD   1 year ago Diabetes mellitus type 2 in nonobese [facility])   [facility] Duanne Butler DASEN, MD   9 years ago Acute maxillary sinusitis, recurrence not specified   [facility], [place], MD              Passed - Cr in normal range and within 360 days    Creat  Date Value Ref Range Status  02/02/2023 0.76 0.70 - 1.35 mg/dL Final   Creatinine, Urine  Date Value Ref Range Status  02/02/2023 70  20 - 320 mg/dL Final         Passed - eGFR in normal range and within 360 days    GFR, Est African American  Date Value Ref Range Status  08/27/2020 112 > OR = 60 mL/min/1.1m2 Final   GFR, Est Non African American  Date Value Ref Range Status  08/27/2020 97 > OR = 60 mL/min/1.64m2 Final   GFR, Estimated  Date Value Ref Range Status  03/02/2022 >60 >60 mL/min Final    Comment:    (NOTE) Calculated using the CKD-EPI Creatinine Equation (2021)    eGFR  Date Value Ref Range Status  02/02/2023 100 > OR = 60 mL/min/1.16m2 Final  07/26/2022 100 >59 mL/min/1.73 Final          pioglitazone  (ACTOS ) 30 MG tablet 90 tablet 0    Sig: Take 1 tablet (30 mg total) by mouth daily.     Endocrinology:  Diabetes - Glitazones - pioglitazone  Failed - 10/19/2023  8:36 AM      Failed - HBA1C is between 0 and 7.9 and within 180 days    Hgb A1c MFr Bld  Date Value Ref Range Status  02/02/2023 7.5 (H) <5.7 % of  total Hgb Final    Comment:    For someone without known diabetes, a hemoglobin A1c value of 6.5% or greater indicates that they may have  diabetes and this should be confirmed with a follow-up  test. . For someone with known diabetes, a value <7% indicates  that their diabetes is well controlled and a value  greater than or equal to 7% indicates suboptimal  control. A1c targets should be individualized based on  duration of diabetes, age, comorbid conditions, and  other considerations. . Currently, no consensus exists regarding use of hemoglobin A1c for diagnosis of diabetes for children. .          Failed - Valid encounter within last 6 months    Recent Outpatient Visits           8 months ago Diabetes mellitus type 2 in nonobese [facility])   [facility] Duanne, Butler DASEN, MD   1 year ago Coronary artery calcification seen on CAT scan   [facility] Duanne Butler DASEN, MD   1 year ago Diabetes mellitus type 2 in nonobese  [facility])   [facility] Duanne Butler DASEN, MD   1 year ago Diabetes mellitus type 2 in nonobese [facility])   [place] [facility] Duanne Butler DASEN, MD   9 years ago Acute maxillary sinusitis, recurrence not specified   [facility], [place], MD              Refused Prescriptions Disp Refills   atorvastatin  (LIPITOR) 80 MG tablet 90 tablet 1     Cardiovascular:  Antilipid - Statins Failed - 10/19/2023  8:36 AM      Failed - Lipid Panel in normal range within the last 12 months    Cholesterol  Date Value Ref Range Status  02/02/2023 113 <200 mg/dL Final   LDL Cholesterol (Calc)  Date Value Ref Range Status  02/02/2023 52 mg/dL (calc) Final    Comment:    Reference range: <100 . Desirable range <100 mg/dL for primary prevention;   <70 mg/dL for patients with CHD or diabetic patients  with > or = 2 CHD risk factors. SABRA LDL-C is now calculated using the Martin-Hopkins  calculation, which is a validated novel method providing  better accuracy than the Friedewald equation in the  estimation of LDL-C.  Gladis APPLETHWAITE et al. SANDREA. 7986;689(80): 2061-2068  (http://education.[facility].com/faq/FAQ164)    HDL  Date Value Ref Range Status  02/02/2023 48 > OR = 40 mg/dL Final   Triglycerides  Date Value Ref Range Status  02/02/2023 44 <150 mg/dL Final         Passed - Patient is not pregnant      Passed - Valid encounter within last 12 months    Recent Outpatient Visits           8 months ago Diabetes mellitus type 2 in nonobese [facility])   [facility] Duanne Butler DASEN, MD   1 year ago Coronary artery calcification seen on CAT scan   [facility] Duanne Butler DASEN, MD   1 year ago Diabetes mellitus type 2 in nonobese [facility])   [facility] Duanne Butler DASEN, MD   1 year ago Diabetes mellitus type 2 in nonobese [facility])   [place]  [facility] Pickard, Butler DASEN, MD   9 years  ago Acute maxillary sinusitis, recurrence not specified   [facility], [place], MD

## 2023-10-22 ENCOUNTER — Other Ambulatory Visit (HOSPITAL_COMMUNITY): Payer: Self-pay

## 2023-10-22 ENCOUNTER — Telehealth: Payer: Self-pay | Admitting: Pharmacy Technician

## 2023-10-22 NOTE — Telephone Encounter (Signed)
 Pharmacy Patient Advocate Encounter   Received notification from Onbase that prior authorization for Farxiga  10MG  tablets is required/requested.   Insurance verification completed.   The patient is insured through [employer] .   Per test claim: PA required; PA submitted to above mentioned insurance via CoverMyMeds Key/confirmation #/EOC [id number] Status is pending

## 2023-10-22 NOTE — Telephone Encounter (Signed)
 Pharmacy Patient Advocate Encounter  Received notification from [employer] that Prior Authorization for Farxiga  10MG  tablets has been APPROVED from 09/22/23 to 10/21/24. Ran test claim, Copay is $0.00. This test claim was processed through [facility]- copay amounts may vary at other pharmacies due to pharmacy/plan contracts, or as the patient moves through the different stages of their insurance plan.   PA #/Case ID/Reference #: [id number]

## 2023-11-02 ENCOUNTER — Telehealth: Admitting: Family Medicine

## 2023-11-02 DIAGNOSIS — E119 Type 2 diabetes mellitus without complications: Secondary | ICD-10-CM | POA: Diagnosis not present

## 2023-11-02 DIAGNOSIS — I2510 Atherosclerotic heart disease of native coronary artery without angina pectoris: Secondary | ICD-10-CM | POA: Diagnosis not present

## 2023-11-02 DIAGNOSIS — I2583 Coronary atherosclerosis due to lipid rich plaque: Secondary | ICD-10-CM | POA: Diagnosis not present

## 2023-11-02 DIAGNOSIS — Z7984 Long term (current) use of oral hypoglycemic drugs: Secondary | ICD-10-CM

## 2023-11-02 DIAGNOSIS — E7800 Pure hypercholesterolemia, unspecified: Secondary | ICD-10-CM

## 2023-11-02 DIAGNOSIS — Z125 Encounter for screening for malignant neoplasm of prostate: Secondary | ICD-10-CM

## 2023-11-02 NOTE — Progress Notes (Signed)
 Subjective:    Patient ID: Collin Farmer, male    DOB: 1960-05-29, 63 y.o.   MRN: [id number] Patient is being seen today as a video visit.  He consents to be seen via video.  He is currently at home.  I am currently in my office.  Visit began at 856.  Visit concluded around 910.  Patient is not checking his blood pressure.  He has no idea what his blood pressure is running.  He has a history of diabetes.  He is not checking his blood sugar.  He states that he feels fine.  He denies any polyuria or polydipsia or blurry vision.  He has a history of coronary artery disease.  He denies any chest pain or shortness of breath.  Also history of aneurysm of the aorta states cardiology is monitoring.  He is overdue for fasting lab work.  He is also due for a PSA.  He continues to smoke with no desire to quit Past Medical History:  Diagnosis Date   Diabetes mellitus type 2 in nonobese [facility])    History of kidney stones    HLD (hyperlipidemia)    Hypertension    Polycythemia    Smoking    Current Outpatient Medications on File Prior to Visit  Medication Sig Dispense Refill   aspirin 81 MG tablet Take 81 mg by mouth daily.     atorvastatin  (LIPITOR) 80 MG tablet TAKE 1 TABLET(80 MG) BY MOUTH DAILY 90 tablet 1   buPROPion  (WELLBUTRIN  XL) 150 MG 24 hr tablet TAKE 1 TABLET(150 MG) BY MOUTH DAILY 30 tablet 2   Continuous Blood Gluc Receiver (DEXCOM G7 RECEIVER) DEVI See admin instructions. (Patient not taking: Reported on 02/02/2023)     Continuous Blood Gluc Sensor (DEXCOM G7 SENSOR) MISC 1 Device by Does not apply route every 14 (fourteen) days. (Patient not taking: Reported on 02/02/2023) 3 each 11   dapagliflozin  propanediol (FARXIGA ) 10 MG TABS tablet Take 1 tablet (10 mg total) by mouth daily. 90 tablet 0   glipiZIDE  (GLUCOTROL  XL) 5 MG 24 hr tablet TAKE 1 TABLET(5 MG) BY MOUTH EVERY EVENING 90 tablet 1   Insulin  Pen Needle (BD PEN NEEDLE MICRO U/F) 32G X 6 MM MISC Pt to use with Semglee  pen to  administer insulin  daily. (Patient not taking: Reported on 02/02/2023) 30 each 1   lisinopril -hydrochlorothiazide  (ZESTORETIC ) 20-12.5 MG tablet TAKE 1 TABLET BY MOUTH DAILY 90 tablet 0   metFORMIN  (GLUCOPHAGE ) 500 MG tablet Take 2 tablets (1,000 mg total) by mouth 2 (two) times daily with a meal. 180 tablet 1   pioglitazone  (ACTOS ) 30 MG tablet Take 1 tablet (30 mg total) by mouth daily. 90 tablet 0   No current facility-administered medications on file prior to visit.   Allergies  Allergen Reactions   Bee Venom Anaphylaxis   Other     Bee stings   Social History   Socioeconomic History   Marital status: Single    Spouse name: Not on file   Number of children: Not on file   Years of education: Not on file   Highest education level: 12th grade  Occupational History   Not on file  Tobacco Use   Smoking status: Every Day    Current packs/day: 0.50    Average packs/day: 0.5 packs/day for 37.0 years (18.5 ttl pk-yrs)    Types: Cigarettes   Smokeless tobacco: Never  Substance and Sexual Activity   Alcohol use: No  Alcohol/week: 0.0 standard drinks of alcohol   Drug use: No   Sexual activity: Never    Birth control/protection: Abstinence  Other Topics Concern   Not on file  Social History Narrative   Not on file   Social Drivers of Health   Financial Resource Strain: Medium Risk (10/29/2023)   Overall Financial Resource Strain (CARDIA)    Difficulty of Paying Living Expenses: Somewhat hard  Food Insecurity: Food Insecurity Present (10/29/2023)   Hunger Vital Sign    Worried About Running Out of Food in the Last Year: Sometimes true    Ran Out of Food in the Last Year: Never true  Transportation Needs: No Transportation Needs (10/29/2023)   PRAPARE - Administrator, Civil Service (Medical): No    Lack of Transportation (Non-Medical): No  Physical Activity: Sufficiently Active (10/29/2023)   Exercise Vital Sign    Days of Exercise per Week: 4 days    Minutes of  Exercise per Session: 60 min  Stress: No Stress Concern Present (10/29/2023)   [employer] of Occupational Health - Occupational Stress Questionnaire    Feeling of Stress: Not at all  Social Connections: Moderately Isolated (10/29/2023)   Social Connection and Isolation Panel    Frequency of Communication with Friends and Family: More than three times a week    Frequency of Social Gatherings with Friends and Family: Twice a week    Attends Religious Services: 1 to 4 times per year    Active Member of [employer] or Organizations: No    Attends Banker Meetings: Not on file    Marital Status: Divorced  Intimate Partner Violence: Not At Risk (04/07/2022)   Humiliation, Afraid, Rape, and Kick questionnaire    Fear of Current or Ex-Partner: No    Emotionally Abused: No    Physically Abused: No    Sexually Abused: No      Review of Systems  All other systems reviewed and are negative.      Objective:      Assessment & Plan:  Diabetes mellitus type 2 in nonobese (HCC) - Plan: Hemoglobin A1c, CBC with Differential/Platelet, Lipid panel, Comprehensive metabolic panel with GFR, Microalbumin/Creatinine Ratio, Urine  Coronary artery disease due to lipid rich plaque  Pure hypercholesterolemia  Prostate cancer screening - Plan: PSA I explained to the patient that his medical problems are called silent killers for a reason.  Just because he feels normal does not mean problems are happening.  I recommended that he check his blood pressure daily.  We want to keep his blood pressure less than 140/90.  He is long overdue for fasting lab work.  I recommended that he come in fasting for CBC a CMP a lipid panel and A1c PSA and a urine protein creatinine ratio.  I would like his LDL cholesterol to be less than 70 and as close to 55 as possible.  Like to see blood pressure well below 140/90.  I want his hemoglobin A1c less than 6.5.  Continue to recommend smoking cessation

## 2023-11-15 ENCOUNTER — Other Ambulatory Visit: Payer: Self-pay | Admitting: Family Medicine

## 2023-11-16 ENCOUNTER — Other Ambulatory Visit

## 2023-11-16 DIAGNOSIS — Z125 Encounter for screening for malignant neoplasm of prostate: Secondary | ICD-10-CM | POA: Diagnosis not present

## 2023-11-16 DIAGNOSIS — E119 Type 2 diabetes mellitus without complications: Secondary | ICD-10-CM | POA: Diagnosis not present

## 2023-11-17 LAB — COMPREHENSIVE METABOLIC PANEL WITH GFR
AG Ratio: 1.9 (calc) (ref 1.0–2.5)
ALT: 19 U/L (ref 9–46)
AST: 17 U/L (ref 10–35)
Albumin: 4.4 g/dL (ref 3.6–5.1)
Alkaline phosphatase (APISO): 81 U/L (ref 35–144)
BUN: 20 mg/dL (ref 7–25)
CO2: 27 mmol/L (ref 20–32)
Calcium: 9.6 mg/dL (ref 8.6–10.3)
Chloride: 105 mmol/L (ref 98–110)
Creat: 1.1 mg/dL (ref 0.70–1.35)
Globulin: 2.3 g/dL (ref 1.9–3.7)
Glucose, Bld: 194 mg/dL — ABNORMAL HIGH (ref 65–99)
Potassium: 4.7 mmol/L (ref 3.5–5.3)
Sodium: 143 mmol/L (ref 135–146)
Total Bilirubin: 0.7 mg/dL (ref 0.2–1.2)
Total Protein: 6.7 g/dL (ref 6.1–8.1)
eGFR: 74 mL/min/1.73m2 (ref >=60)

## 2023-11-17 LAB — LIPID PANEL
Cholesterol: 116 mg/dL (ref <200)
HDL: 51 mg/dL (ref >=40)
LDL Cholesterol (Calc): 51 mg/dL
Non-HDL Cholesterol (Calc): 65 mg/dL (ref <130)
Total CHOL/HDL Ratio: 2.3 (calc) (ref <5.0)
Triglycerides: 61 mg/dL (ref <150)

## 2023-11-17 LAB — CBC WITH DIFFERENTIAL/PLATELET
Absolute Lymphocytes: 2706 {cells}/uL (ref 850–3900)
Absolute Monocytes: 1046 {cells}/uL — ABNORMAL HIGH (ref 200–950)
Basophils Absolute: 74 {cells}/uL (ref 0–200)
Basophils Relative: 0.6 %
Eosinophils Absolute: 123 {cells}/uL (ref 15–500)
Eosinophils Relative: 1 %
HCT: 55.2 % — ABNORMAL HIGH (ref 38.5–50.0)
Hemoglobin: 17.8 g/dL — ABNORMAL HIGH (ref 13.2–17.1)
MCH: 30.1 pg (ref 27.0–33.0)
MCHC: 32.2 g/dL (ref 32.0–36.0)
MCV: 93.2 fL (ref 80.0–100.0)
MPV: 10.5 fL (ref 7.5–12.5)
Monocytes Relative: 8.5 %
Neutro Abs: 8352 {cells}/uL — ABNORMAL HIGH (ref 1500–7800)
Neutrophils Relative %: 67.9 %
Platelets: 252 Thousand/uL (ref 140–400)
RBC: 5.92 Million/uL — ABNORMAL HIGH (ref 4.20–5.80)
RDW: 13 % (ref 11.0–15.0)
Total Lymphocyte: 22 %
WBC: 12.3 Thousand/uL — ABNORMAL HIGH (ref 3.8–10.8)

## 2023-11-17 LAB — PSA: PSA: 1.57 ng/mL (ref <=4.00)

## 2023-11-17 LAB — MICROALBUMIN / CREATININE URINE RATIO
Creatinine, Urine: 87 mg/dL (ref 20–320)
Microalb Creat Ratio: 26 mg/g{creat} (ref <30)
Microalb, Ur: 2.3 mg/dL

## 2023-11-17 LAB — HEMOGLOBIN A1C
Hgb A1c MFr Bld: 7.7 % — ABNORMAL HIGH (ref <5.7)
Mean Plasma Glucose: 174 mg/dL
eAG (mmol/L): 9.7 mmol/L

## 2023-11-19 ENCOUNTER — Ambulatory Visit: Payer: Self-pay | Admitting: Family Medicine

## 2023-11-22 ENCOUNTER — Other Ambulatory Visit: Payer: Self-pay

## 2023-11-22 DIAGNOSIS — E119 Type 2 diabetes mellitus without complications: Secondary | ICD-10-CM

## 2023-11-22 DIAGNOSIS — E7800 Pure hypercholesterolemia, unspecified: Secondary | ICD-10-CM

## 2023-11-22 DIAGNOSIS — I2510 Atherosclerotic heart disease of native coronary artery without angina pectoris: Secondary | ICD-10-CM

## 2023-11-22 MED ORDER — OZEMPIC (0.25 OR 0.5 MG/DOSE) 2 MG/3ML ~~LOC~~ SOPN: 0.2500 mg | PEN_INJECTOR | SUBCUTANEOUS | 1 refills | Status: AC

## 2023-11-23 ENCOUNTER — Other Ambulatory Visit (HOSPITAL_COMMUNITY): Payer: Self-pay

## 2023-11-23 ENCOUNTER — Telehealth: Payer: Self-pay | Admitting: Pharmacy Technician

## 2023-11-23 NOTE — Telephone Encounter (Signed)
 Pharmacy Patient Advocate Encounter   Received notification from Onbase that prior authorization for Ozempic  (0.25 or 0.5 MG/DOSE) 2MG /3ML pen-injectors  is required/requested.   Insurance verification completed.   The patient is insured through [employer] .   Per test claim: PA required; PA submitted to above mentioned insurance via Latent Key/confirmation #/[id number] Status is pending

## 2023-11-23 NOTE — Telephone Encounter (Signed)
 Pharmacy Patient Advocate Encounter  Received notification from [facility] that Prior Authorization for Ozempic  (0.25 or 0.5 MG/DOSE) 2MG /3ML pen-injectors  has been APPROVED from 10/24/23 to 11/22/24. Ran test claim, Copay is $0.00. This test claim was processed through [facility]- copay amounts may vary at other pharmacies due to pharmacy/plan contracts, or as the patient moves through the different stages of their insurance plan.   PA #/Case ID/Reference #: [id number]

## 2023-11-26 ENCOUNTER — Other Ambulatory Visit (HOSPITAL_COMMUNITY): Payer: Self-pay

## 2023-12-14 ENCOUNTER — Other Ambulatory Visit: Payer: Self-pay | Admitting: Family Medicine

## 2023-12-14 DIAGNOSIS — I10 Essential (primary) hypertension: Secondary | ICD-10-CM

## 2023-12-25 ENCOUNTER — Other Ambulatory Visit: Payer: Self-pay | Admitting: Family Medicine

## 2024-01-17 ENCOUNTER — Other Ambulatory Visit: Payer: Self-pay | Admitting: Family Medicine

## 2024-01-17 DIAGNOSIS — E119 Type 2 diabetes mellitus without complications: Secondary | ICD-10-CM

## 2024-01-17 DIAGNOSIS — I2510 Atherosclerotic heart disease of native coronary artery without angina pectoris: Secondary | ICD-10-CM

## 2024-03-20 ENCOUNTER — Telehealth: Payer: Self-pay

## 2024-03-20 ENCOUNTER — Other Ambulatory Visit: Payer: Self-pay

## 2024-03-20 DIAGNOSIS — E119 Type 2 diabetes mellitus without complications: Secondary | ICD-10-CM

## 2024-03-20 MED ORDER — GLIPIZIDE ER 5 MG PO TB24: 5.0000 mg | ORAL_TABLET | Freq: Every day | ORAL | 0 refills | Status: AC

## 2024-03-20 NOTE — Telephone Encounter (Signed)
 Copied from CRM [id number]. Topic: Clinical - Prescription Issue >> Mar 20, 2024  9:16 AM Deleta RAMAN wrote: Reason for CRM: patient would like a refill on glipiZIDE  (GLUCOTROL  XL) 5 MG 24 hr tablet but he is currently out of his refills. He also would like any other medication filled.

## 2024-03-21 ENCOUNTER — Other Ambulatory Visit: Payer: Self-pay | Admitting: Family Medicine

## 2024-03-21 DIAGNOSIS — E119 Type 2 diabetes mellitus without complications: Secondary | ICD-10-CM

## 2024-03-29 ENCOUNTER — Other Ambulatory Visit: Payer: Self-pay | Admitting: Family Medicine

## 2024-03-29 DIAGNOSIS — E119 Type 2 diabetes mellitus without complications: Secondary | ICD-10-CM

## 2024-04-16 ENCOUNTER — Other Ambulatory Visit: Payer: Self-pay | Admitting: Family Medicine

## 2024-04-16 DIAGNOSIS — I2510 Atherosclerotic heart disease of native coronary artery without angina pectoris: Secondary | ICD-10-CM

## 2024-04-16 DIAGNOSIS — E119 Type 2 diabetes mellitus without complications: Secondary | ICD-10-CM

## 2024-04-16 DIAGNOSIS — I10 Essential (primary) hypertension: Secondary | ICD-10-CM

## 2024-05-23 ENCOUNTER — Encounter: Admitting: Family Medicine
